# Patient Record
Sex: Female | Born: 1935
Health system: Southern US, Community
[De-identification: ages and names within clinical notes are randomized; demographics above are authoritative.]

## PROBLEM LIST (undated history)

## (undated) DIAGNOSIS — Z85828 Personal history of other malignant neoplasm of skin: Secondary | ICD-10-CM

## (undated) DIAGNOSIS — N8110 Cystocele, unspecified: Secondary | ICD-10-CM

## (undated) DIAGNOSIS — T148XXA Other injury of unspecified body region, initial encounter: Secondary | ICD-10-CM

## (undated) DIAGNOSIS — I10 Essential (primary) hypertension: Secondary | ICD-10-CM

## (undated) HISTORY — DX: Personal history of other malignant neoplasm of skin: Z85.828

## (undated) HISTORY — PX: ABDOMINAL HYSTERECTOMY: SHX81

---

## 2006-10-07 ENCOUNTER — Emergency Department: Payer: Self-pay | Admitting: Emergency Medicine

## 2011-04-25 ENCOUNTER — Emergency Department: Payer: Self-pay

## 2012-06-12 ENCOUNTER — Emergency Department: Payer: Self-pay | Admitting: *Deleted

## 2012-06-12 LAB — CBC WITH DIFFERENTIAL/PLATELET
Basophil %: 0.7 %
Eosinophil %: 2.3 %
HCT: 41.9 % (ref 35.0–47.0)
HGB: 14.5 g/dL (ref 12.0–16.0)
Lymphocyte %: 19.5 %
MCH: 32.3 pg (ref 26.0–34.0)
Neutrophil %: 69.4 %

## 2012-06-12 LAB — URINALYSIS, COMPLETE
Bilirubin,UR: NEGATIVE
Ketone: NEGATIVE
Ph: 6 (ref 4.5–8.0)
Protein: NEGATIVE
Specific Gravity: 1.012 (ref 1.003–1.030)

## 2012-06-12 LAB — BASIC METABOLIC PANEL
Anion Gap: 9 (ref 7–16)
BUN: 16 mg/dL (ref 7–18)
Chloride: 105 mmol/L (ref 98–107)
Creatinine: 0.88 mg/dL (ref 0.60–1.30)
EGFR (African American): >60
EGFR (Non-African Amer.): >60
Osmolality: 282 (ref 275–301)
Potassium: 4.1 mmol/L (ref 3.5–5.1)
Sodium: 141 mmol/L (ref 136–145)

## 2012-12-02 DIAGNOSIS — M549 Dorsalgia, unspecified: Secondary | ICD-10-CM | POA: Insufficient documentation

## 2013-02-13 ENCOUNTER — Encounter: Payer: Self-pay | Admitting: Family Medicine

## 2013-03-12 ENCOUNTER — Encounter: Payer: Self-pay | Admitting: Family Medicine

## 2013-08-26 ENCOUNTER — Ambulatory Visit: Payer: Self-pay | Admitting: Ophthalmology

## 2013-09-16 ENCOUNTER — Ambulatory Visit: Payer: Self-pay | Admitting: Ophthalmology

## 2013-11-27 ENCOUNTER — Emergency Department: Payer: Self-pay | Admitting: Emergency Medicine

## 2014-03-23 ENCOUNTER — Ambulatory Visit: Payer: Self-pay | Admitting: Obstetrics and Gynecology

## 2015-05-29 ENCOUNTER — Emergency Department: Payer: Medicare HMO

## 2015-05-29 ENCOUNTER — Encounter: Payer: Self-pay | Admitting: Radiology

## 2015-05-29 ENCOUNTER — Emergency Department
Admission: EM | Admit: 2015-05-29 | Discharge: 2015-05-29 | Disposition: A | Discharge status: Home / Self Care | Payer: Medicare HMO | Attending: Emergency Medicine | Admitting: Emergency Medicine

## 2015-05-29 DIAGNOSIS — K5730 Diverticulosis of large intestine without perforation or abscess without bleeding: Secondary | ICD-10-CM

## 2015-05-29 DIAGNOSIS — I10 Essential (primary) hypertension: Secondary | ICD-10-CM | POA: Insufficient documentation

## 2015-05-29 DIAGNOSIS — R1032 Left lower quadrant pain: Secondary | ICD-10-CM | POA: Insufficient documentation

## 2015-05-29 DIAGNOSIS — M545 Low back pain, unspecified: Secondary | ICD-10-CM

## 2015-05-29 DIAGNOSIS — R109 Unspecified abdominal pain: Secondary | ICD-10-CM

## 2015-05-29 HISTORY — DX: Other injury of unspecified body region, initial encounter: T14.8XXA

## 2015-05-29 HISTORY — DX: Cystocele, unspecified: N81.10

## 2015-05-29 HISTORY — DX: Essential (primary) hypertension: I10

## 2015-05-29 LAB — URINALYSIS COMPLETE WITH MICROSCOPIC (ARMC ONLY)
Bacteria, UA: NONE SEEN
Bilirubin Urine: NEGATIVE
GLUCOSE, UA: NEGATIVE mg/dL
Ketones, ur: NEGATIVE mg/dL
NITRITE: NEGATIVE
Protein, ur: NEGATIVE mg/dL
Specific Gravity, Urine: 1.002 — ABNORMAL LOW (ref 1.005–1.030)
Squamous Epithelial / LPF: NONE SEEN
pH: 6 (ref 5.0–8.0)

## 2015-05-29 MED ORDER — OXYCODONE-ACETAMINOPHEN 5-325 MG PO TABS: 1.0000 | ORAL_TABLET | ORAL | Status: DC | PRN

## 2015-05-29 NOTE — ED Provider Notes (Signed)
[facility] Emergency Department Provider Note  ____________________________________________  Time seen: 9:50 AM  I have reviewed the triage vital signs and the nursing notes.   HISTORY  Chief Complaint Urinary Tract Infection     HPI Alexis Neal is a 79 y.o. female resents with low back pain described as burning currently 6 out of 10 for approximately 2 months. Worsen over the last couple days patient is concern for possibility of urinary tract infection for which she says she's had multiple secondary to a prolapsed bladder. Denies any difficulty urinating no pain on urination no fever no chills. Of note the patient states this may very well be her sciatica as she does suffer from that as well.    Past medical history Sciatica Prolapsed bladder UTI There are no active problems to display for this patient.  Current Outpatient Rx  Name  Route  Sig  Dispense  Refill  . oxyCODONE-acetaminophen (ROXICET) 5-325 MG per tablet   Oral   Take 1 tablet by mouth every 4 (four) hours as needed for severe pain.   20 tablet   0     Allergies Review of patient's allergies indicates no known allergies.  History reviewed. No pertinent family history.  Social History History  Substance Use Topics  . Smoking status: Never Smoker   . Smokeless tobacco: Not on file  . Alcohol Use: No    Review of Systems  Constitutional: Negative for fever. Eyes: Negative for visual changes. ENT: Negative for sore throat. Cardiovascular: Negative for chest pain. Respiratory: Negative for shortness of breath. Gastrointestinal: Negative for abdominal pain, vomiting and diarrhea. Genitourinary: Negative for dysuria. Musculoskeletal: Positive for back pain. Skin: Negative for rash. Neurological: Negative for headaches, focal weakness or numbness.   10-point ROS otherwise negative.  ____________________________________________   PHYSICAL EXAM:  VITAL SIGNS: ED  Triage Vitals  Enc Vitals Group     BP 05/29/15 0908 181/36 mmHg     Pulse Rate 05/29/15 0908 95     Resp 05/29/15 0908 18     Temp 05/29/15 0908 98 F (36.7 C)     Temp Source 05/29/15 0908 Oral     SpO2 05/29/15 0908 52 %     Weight --      Height --      Head Cir --      Peak Flow --      Pain Score 05/29/15 0908 9     Pain Loc --      Pain Edu? --      Excl. in [place]? --      Constitutional: Alert and oriented. Well appearing and in no distress. Eyes: Conjunctivae are normal. PERRL. Normal extraocular movements. ENT   Head: Normocephalic and atraumatic.   Nose: No congestion/rhinnorhea.   Mouth/Throat: Mucous membranes are moist.   Neck: No stridor. Hematological/Lymphatic/Immunilogical: No cervical lymphadenopathy. Cardiovascular: Normal rate, regular rhythm. Normal and symmetric distal pulses are present in all extremities. No murmurs, rubs, or gallops. Respiratory: Normal respiratory effort without tachypnea nor retractions. Breath sounds are clear and equal bilaterally. No wheezes/rales/rhonchi. Gastrointestinal: Soft and nontender. No distention. There is no CVA tenderness. Genitourinary: deferred Musculoskeletal: Nontender with normal range of motion in all extremities. No joint effusions.  No lower extremity tenderness nor edema. Pain with palpation of paraspinal muscles. No CVA tenderness. Neurologic:  Normal speech and language. No gross focal neurologic deficits are appreciated. Speech is normal.  Skin:  Skin is warm, dry and intact. No rash noted.  Psychiatric: Mood and affect are normal. Speech and behavior are normal. Patient exhibits appropriate insight and judgment.  ____________________________________________    LABS (pertinent positives/negatives)  Labs Reviewed  URINALYSIS COMPLETEWITH MICROSCOPIC ([place]) - Abnormal; Notable for the following:    Color, Urine COLORLESS (*)    APPearance CLEAR (*)    Specific Gravity, Urine 1.002 (*)     Hgb urine dipstick 1+ (*)    Leukocytes, UA TRACE (*)    All other components within normal limits         RADIOLOGY  CT abdomen and pelvis revealed diverticulosis   Bentley, Haralson #[id number] (78 y.o. F) ED4A        Lab Results       Urinalysis complete, with microscopic- may I&O cath if menses [facility] only) (Final result)Abnormal Component (Lab Inquiry)    Collection Time Result Time Color, Urine APPearance GLUCOSE U BILI UR Ketones, ur   05/29/15 09:17:00 05/29/15 09:26:07 COLORLESS (A) CLEAR (A) NEGATIVE NEGATIVE NEGATIVE      Collection Time Result Time Specific Gravity, Urine Hgb urine dipstick pH Protein, ur Nitrite   05/29/15 09:17:00 05/29/15 09:26:07 1.002 (L) 1+ (A) 6.0 NEGATIVE NEGATIVE      Collection Time Result Time LEUKOCYTES RBC / HPF WBC U BACTERIA Squamous Epithelial / LPF   05/29/15 09:17:00 05/29/15 09:26:07 TRACE (A) 0-5 0-5 NONE SEEN NONE SEEN           Imaging Results       CT RENAL STONE STUDY (Final result) Result time: 05/29/15 10:33:40   Final result by Rad Results In Interface (05/29/15 10:33:40)   Narrative:   CLINICAL DATA: Left flank pain  EXAM: CT ABDOMEN AND PELVIS WITHOUT CONTRAST  TECHNIQUE: Multidetector CT imaging of the abdomen and pelvis was performed following the standard protocol without IV contrast.  COMPARISON: 03/23/2014  FINDINGS: Lower chest: There is no pleural effusion identified. The lung bases are clear. Changes of centrilobular emphysema noted.  Hepatobiliary: Mild diffuse hepatic steatosis. No focal liver abnormality identified. The gallbladder appears normal. There is no biliary dilatation.  Pancreas: Negative  Spleen: The spleen is unremarkable.  Adrenals/Urinary Tract: The adrenal glands are both normal. Scarring is noted involving the upper pole the right kidney. No right-sided nephrolithiasis or hydronephrosis. There is no left hydronephrosis or hydroureter. No  left-sided kidney stones. The urinary bladder appears within normal limits.  Stomach/Bowel: Small hiatal hernia. The stomach and the small bowel loops have a normal course and caliber. There is no evidence for a bowel obstruction. The appendix is visualized and appears normal. Numerous colonic diverticula are noted. There is no acute inflammation.  Vascular/Lymphatic: Calcified atherosclerotic disease involves the abdominal aorta. No aneurysm. No enlarged retroperitoneal or mesenteric adenopathy. No enlarged pelvic or inguinal lymph nodes.  Reproductive: Previous hysterectomy. There is no adnexal mass.  Other: No free fluid or fluid collections within the abdomen or the pelvis.  Musculoskeletal: Degenerative disc disease is present within the lumbar spine. No acute bone abnormality noted.  IMPRESSION: 1. No acute findings and no explanation for left flank pain. 2. No nephrolithiasis or obstructive uropathy. 3. Colonic diverticulosis without acute inflammation. 4. Aortic atherosclerosis 5. Hepatic steatosis.   Electronically Signed By: Kerby Moors M.D. On: 05/29/2015 10:33        ECG Results    None        INITIAL IMPRESSION / ASSESSMENT AND PLAN / ED COURSE  Pertinent labs & imaging results that were available during my care of the  patient were reviewed by me and considered in my medical decision making (see chart for details).    ____________________________________________   FINAL CLINICAL IMPRESSION(S) / ED DIAGNOSES  Final diagnoses:  Left flank pain  Bilateral low back pain without sciatica  Diverticulosis of large intestine without hemorrhage      Gregor Hams, MD 05/31/15 [phone]

## 2015-05-29 NOTE — Discharge Instructions (Signed)
Back Pain, Adult Low back pain is very common. About 1 in 5 people have back pain.The cause of low back pain is rarely dangerous. The pain often gets better over time.About half of people with a sudden onset of back pain feel better in just 2 weeks. About 8 in 10 people feel better by 6 weeks.  CAUSES Some common causes of back pain include:  Strain of the muscles or ligaments supporting the spine.  Wear and tear (degeneration) of the spinal discs.  Arthritis.  Direct injury to the back. DIAGNOSIS Most of the time, the direct cause of low back pain is not known.However, back pain can be treated effectively even when the exact cause of the pain is unknown.Answering your caregiver's questions about your overall health and symptoms is one of the most accurate ways to make sure the cause of your pain is not dangerous. If your caregiver needs more information, he or she may order lab work or imaging tests (X-rays or MRIs).However, even if imaging tests show changes in your back, this usually does not require surgery. HOME CARE INSTRUCTIONS For many people, back pain returns.Since low back pain is rarely dangerous, it is often a condition that people can learn to manageon their own.   Remain active. It is stressful on the back to sit or stand in one place. Do not sit, drive, or stand in one place for more than 30 minutes at a time. Take short walks on level surfaces as soon as pain allows.Try to increase the length of time you walk each day.  Do not stay in bed.Resting more than 1 or 2 days can delay your recovery.  Do not avoid exercise or work.Your body is made to move.It is not dangerous to be active, even though your back may hurt.Your back will likely heal faster if you return to being active before your pain is gone.  Pay attention to your body when you bend and lift. Many people have less discomfortwhen lifting if they bend their knees, keep the load close to their bodies,and  avoid twisting. Often, the most comfortable positions are those that put less stress on your recovering back.  Find a comfortable position to sleep. Use a firm mattress and lie on your side with your knees slightly bent. If you lie on your back, put a pillow under your knees.  Only take over-the-counter or prescription medicines as directed by your caregiver. Over-the-counter medicines to reduce pain and inflammation are often the most helpful.Your caregiver may prescribe muscle relaxant drugs.These medicines help dull your pain so you can more quickly return to your normal activities and healthy exercise.  Put ice on the injured area.  Put ice in a plastic bag.  Place a towel between your skin and the bag.  Leave the ice on for 15-20 minutes, 03-04 times a day for the first 2 to 3 days. After that, ice and heat may be alternated to reduce pain and spasms.  Ask your caregiver about trying back exercises and gentle massage. This may be of some benefit.  Avoid feeling anxious or stressed.Stress increases muscle tension and can worsen back pain.It is important to recognize when you are anxious or stressed and learn ways to manage it.Exercise is a great option. SEEK MEDICAL CARE IF:  You have pain that is not relieved with rest or medicine.  You have pain that does not improve in 1 week.  You have new symptoms.  You are generally not feeling well. SEEK   IMMEDIATE MEDICAL CARE IF:   You have pain that radiates from your back into your legs.  You develop new bowel or bladder control problems.  You have unusual weakness or numbness in your arms or legs.  You develop nausea or vomiting.  You develop abdominal pain.  You feel faint. Document Released: 10/29/2005 Document Revised: 04/29/2012 Document Reviewed: 03/02/2014 Exit[employer] Patient Information 2015 [employer]. This information is not intended to replace advice given to you by your health care provider. Make sure you  discuss any questions you have with your health care provider.  

## 2015-05-29 NOTE — ED Notes (Signed)
MD Brown at bedside.

## 2015-05-29 NOTE — ED Notes (Signed)
Pt reports lower back pain, flank pain and lower abdominal pain for 2 months. Pain described at burning. Pt concerned about UTI.

## 2015-05-29 NOTE — ED Notes (Signed)
Pt walked to the restroom.

## 2015-05-29 NOTE — ED Notes (Signed)
Pt to CT

## 2016-01-19 DIAGNOSIS — Z85828 Personal history of other malignant neoplasm of skin: Secondary | ICD-10-CM | POA: Diagnosis not present

## 2016-01-19 DIAGNOSIS — C44300 Unspecified malignant neoplasm of skin of unspecified part of face: Secondary | ICD-10-CM | POA: Insufficient documentation

## 2016-01-19 DIAGNOSIS — R3129 Other microscopic hematuria: Secondary | ICD-10-CM | POA: Diagnosis not present

## 2016-01-19 DIAGNOSIS — K449 Diaphragmatic hernia without obstruction or gangrene: Secondary | ICD-10-CM | POA: Insufficient documentation

## 2016-01-19 DIAGNOSIS — Z780 Asymptomatic menopausal state: Secondary | ICD-10-CM | POA: Diagnosis not present

## 2016-01-19 DIAGNOSIS — K760 Fatty (change of) liver, not elsewhere classified: Secondary | ICD-10-CM | POA: Diagnosis not present

## 2016-01-19 DIAGNOSIS — M5416 Radiculopathy, lumbar region: Secondary | ICD-10-CM | POA: Diagnosis not present

## 2016-01-19 DIAGNOSIS — K5730 Diverticulosis of large intestine without perforation or abscess without bleeding: Secondary | ICD-10-CM | POA: Insufficient documentation

## 2016-01-19 DIAGNOSIS — I10 Essential (primary) hypertension: Secondary | ICD-10-CM | POA: Diagnosis not present

## 2016-01-19 DIAGNOSIS — H0263 Xanthelasma of right eye, unspecified eyelid: Secondary | ICD-10-CM | POA: Insufficient documentation

## 2016-01-20 ENCOUNTER — Other Ambulatory Visit: Payer: Self-pay | Admitting: Family Medicine

## 2016-01-20 DIAGNOSIS — Z780 Asymptomatic menopausal state: Secondary | ICD-10-CM

## 2016-01-26 ENCOUNTER — Telehealth: Payer: Self-pay | Admitting: Obstetrics and Gynecology

## 2016-01-26 NOTE — Telephone Encounter (Signed)
I received a referral from Alexis Neal from [facility] and when i called to make the appt the pt did not want to schd one at this time. She said she would cb later when she was ready to make one.  michelle

## 2016-07-03 DIAGNOSIS — K449 Diaphragmatic hernia without obstruction or gangrene: Secondary | ICD-10-CM | POA: Diagnosis not present

## 2016-07-03 DIAGNOSIS — M5442 Lumbago with sciatica, left side: Secondary | ICD-10-CM | POA: Diagnosis not present

## 2016-07-03 DIAGNOSIS — H0263 Xanthelasma of right eye, unspecified eyelid: Secondary | ICD-10-CM | POA: Diagnosis not present

## 2016-07-03 DIAGNOSIS — Z7689 Persons encountering health services in other specified circumstances: Secondary | ICD-10-CM | POA: Diagnosis not present

## 2016-07-03 DIAGNOSIS — G8929 Other chronic pain: Secondary | ICD-10-CM | POA: Diagnosis not present

## 2016-07-03 DIAGNOSIS — I10 Essential (primary) hypertension: Secondary | ICD-10-CM | POA: Diagnosis not present

## 2016-12-26 DIAGNOSIS — Z7689 Persons encountering health services in other specified circumstances: Secondary | ICD-10-CM | POA: Diagnosis not present

## 2016-12-26 DIAGNOSIS — M5442 Lumbago with sciatica, left side: Secondary | ICD-10-CM | POA: Diagnosis not present

## 2016-12-26 DIAGNOSIS — G8929 Other chronic pain: Secondary | ICD-10-CM | POA: Diagnosis not present

## 2016-12-26 DIAGNOSIS — K449 Diaphragmatic hernia without obstruction or gangrene: Secondary | ICD-10-CM | POA: Diagnosis not present

## 2016-12-26 DIAGNOSIS — I10 Essential (primary) hypertension: Secondary | ICD-10-CM | POA: Diagnosis not present

## 2016-12-26 DIAGNOSIS — H0263 Xanthelasma of right eye, unspecified eyelid: Secondary | ICD-10-CM | POA: Diagnosis not present

## 2016-12-31 DIAGNOSIS — M7061 Trochanteric bursitis, right hip: Secondary | ICD-10-CM | POA: Diagnosis not present

## 2016-12-31 DIAGNOSIS — K449 Diaphragmatic hernia without obstruction or gangrene: Secondary | ICD-10-CM | POA: Diagnosis not present

## 2016-12-31 DIAGNOSIS — Z0000 Encounter for general adult medical examination without abnormal findings: Secondary | ICD-10-CM | POA: Diagnosis not present

## 2016-12-31 DIAGNOSIS — I10 Essential (primary) hypertension: Secondary | ICD-10-CM | POA: Diagnosis not present

## 2017-03-09 ENCOUNTER — Emergency Department
Admission: EM | Admit: 2017-03-09 | Discharge: 2017-03-09 | Disposition: A | Discharge status: Home / Self Care | Payer: PPO | Attending: Emergency Medicine | Admitting: Emergency Medicine

## 2017-03-09 ENCOUNTER — Encounter: Payer: Self-pay | Admitting: Emergency Medicine

## 2017-03-09 ENCOUNTER — Emergency Department: Payer: PPO

## 2017-03-09 DIAGNOSIS — J705 Respiratory conditions due to smoke inhalation: Secondary | ICD-10-CM | POA: Insufficient documentation

## 2017-03-09 DIAGNOSIS — Z79899 Other long term (current) drug therapy: Secondary | ICD-10-CM | POA: Diagnosis not present

## 2017-03-09 DIAGNOSIS — I10 Essential (primary) hypertension: Secondary | ICD-10-CM | POA: Diagnosis not present

## 2017-03-09 DIAGNOSIS — J449 Chronic obstructive pulmonary disease, unspecified: Secondary | ICD-10-CM | POA: Diagnosis not present

## 2017-03-09 DIAGNOSIS — T59811A Toxic effect of smoke, accidental (unintentional), initial encounter: Secondary | ICD-10-CM
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
## 2017-04-26 DIAGNOSIS — M7061 Trochanteric bursitis, right hip: Secondary | ICD-10-CM | POA: Diagnosis not present

## 2017-04-26 DIAGNOSIS — I10 Essential (primary) hypertension: Secondary | ICD-10-CM | POA: Diagnosis not present

## 2017-04-26 DIAGNOSIS — K449 Diaphragmatic hernia without obstruction or gangrene: Secondary | ICD-10-CM | POA: Diagnosis not present

## 2017-08-28 DIAGNOSIS — M5442 Lumbago with sciatica, left side: Secondary | ICD-10-CM | POA: Diagnosis not present

## 2017-08-28 DIAGNOSIS — I10 Essential (primary) hypertension: Secondary | ICD-10-CM | POA: Diagnosis not present

## 2017-08-28 DIAGNOSIS — G8929 Other chronic pain: Secondary | ICD-10-CM | POA: Diagnosis not present

## 2017-09-26 DIAGNOSIS — H109 Unspecified conjunctivitis: Secondary | ICD-10-CM | POA: Diagnosis not present

## 2017-09-26 DIAGNOSIS — I10 Essential (primary) hypertension: Secondary | ICD-10-CM | POA: Diagnosis not present

## 2017-09-26 DIAGNOSIS — R1032 Left lower quadrant pain: Secondary | ICD-10-CM | POA: Diagnosis not present

## 2017-09-26 DIAGNOSIS — J019 Acute sinusitis, unspecified: Secondary | ICD-10-CM | POA: Diagnosis not present

## 2017-12-16 DIAGNOSIS — G8929 Other chronic pain: Secondary | ICD-10-CM | POA: Diagnosis not present

## 2017-12-16 DIAGNOSIS — M5442 Lumbago with sciatica, left side: Secondary | ICD-10-CM | POA: Diagnosis not present

## 2017-12-16 DIAGNOSIS — I10 Essential (primary) hypertension: Secondary | ICD-10-CM | POA: Diagnosis not present

## 2017-12-16 DIAGNOSIS — K5730 Diverticulosis of large intestine without perforation or abscess without bleeding: Secondary | ICD-10-CM | POA: Diagnosis not present

## 2017-12-16 DIAGNOSIS — R32 Unspecified urinary incontinence: Secondary | ICD-10-CM | POA: Diagnosis not present

## 2017-12-21 ENCOUNTER — Emergency Department: Payer: PPO

## 2017-12-21 ENCOUNTER — Encounter: Payer: Self-pay | Admitting: Emergency Medicine

## 2017-12-21 DIAGNOSIS — S8991XA Unspecified injury of right lower leg, initial encounter: Secondary | ICD-10-CM | POA: Diagnosis not present

## 2017-12-21 DIAGNOSIS — S0990XA Unspecified injury of head, initial encounter: Secondary | ICD-10-CM | POA: Insufficient documentation

## 2017-12-21 DIAGNOSIS — S8992XA Unspecified injury of left lower leg, initial encounter: Secondary | ICD-10-CM | POA: Diagnosis not present

## 2017-12-21 DIAGNOSIS — Y939 Activity, unspecified: Secondary | ICD-10-CM | POA: Diagnosis not present

## 2017-12-21 DIAGNOSIS — I10 Essential (primary) hypertension: Secondary | ICD-10-CM | POA: Insufficient documentation

## 2017-12-21 DIAGNOSIS — S20212A Contusion of left front wall of thorax, initial encounter: Secondary | ICD-10-CM | POA: Diagnosis not present

## 2017-12-21 DIAGNOSIS — M25561 Pain in right knee: Secondary | ICD-10-CM | POA: Diagnosis not present

## 2017-12-21 DIAGNOSIS — Y998 Other external cause status: Secondary | ICD-10-CM | POA: Insufficient documentation

## 2017-12-21 DIAGNOSIS — Y929 Unspecified place or not applicable: Secondary | ICD-10-CM | POA: Diagnosis not present

## 2017-12-21 DIAGNOSIS — W101XXA Fall (on)(from) sidewalk curb, initial encounter: Secondary | ICD-10-CM | POA: Diagnosis not present

## 2017-12-21 DIAGNOSIS — M25562 Pain in left knee: Secondary | ICD-10-CM | POA: Diagnosis not present

## 2017-12-21 DIAGNOSIS — S299XXA Unspecified injury of thorax, initial encounter: Secondary | ICD-10-CM | POA: Diagnosis not present

## 2017-12-21 DIAGNOSIS — S80212A Abrasion, left knee, initial encounter: Secondary | ICD-10-CM | POA: Insufficient documentation

## 2017-12-21 DIAGNOSIS — S0993XA Unspecified injury of face, initial encounter: Secondary | ICD-10-CM | POA: Diagnosis not present

## 2017-12-21 DIAGNOSIS — S80211A Abrasion, right knee, initial encounter: Secondary | ICD-10-CM | POA: Insufficient documentation

## 2017-12-21 DIAGNOSIS — S199XXA Unspecified injury of neck, initial encounter: Secondary | ICD-10-CM | POA: Diagnosis not present
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
## 2017-12-22 ENCOUNTER — Other Ambulatory Visit: Payer: Self-pay

## 2017-12-22 ENCOUNTER — Emergency Department
Admission: EM | Admit: 2017-12-22 | Discharge: 2017-12-22 | Disposition: A | Discharge status: Home / Self Care | Payer: PPO | Attending: Emergency Medicine | Admitting: Emergency Medicine

## 2017-12-22 ENCOUNTER — Emergency Department: Payer: PPO

## 2017-12-22 DIAGNOSIS — S80211A Abrasion, right knee, initial encounter: Secondary | ICD-10-CM | POA: Diagnosis not present

## 2017-12-22 DIAGNOSIS — S80212A Abrasion, left knee, initial encounter: Secondary | ICD-10-CM

## 2017-12-22 DIAGNOSIS — S20212A Contusion of left front wall of thorax, initial encounter: Secondary | ICD-10-CM

## 2017-12-22 DIAGNOSIS — S299XXA Unspecified injury of thorax, initial encounter: Secondary | ICD-10-CM | POA: Diagnosis not present

## 2017-12-22 DIAGNOSIS — W19XXXA Unspecified fall, initial encounter: Secondary | ICD-10-CM

## 2017-12-22 DIAGNOSIS — S0990XA Unspecified injury of head, initial encounter: Secondary | ICD-10-CM

## 2017-12-22 MED ORDER — ONDANSETRON 4 MG PO TBDP: 4.0000 mg | ORAL_TABLET | Freq: Three times a day (TID) | ORAL | 0 refills | Status: DC | PRN

## 2017-12-22 MED ORDER — TRAMADOL HCL 50 MG PO TABS: 50.0000 mg | ORAL_TABLET | Freq: Four times a day (QID) | ORAL | 0 refills | Status: DC | PRN

## 2017-12-22 MED ORDER — TRAMADOL HCL 50 MG PO TABS
50.0000 mg | ORAL_TABLET | Freq: Once | ORAL | Status: AC
  Administered 2017-12-22: 50 mg via ORAL
  Filled 2017-12-22: qty 1

## 2017-12-22 MED ORDER — ONDANSETRON 4 MG PO TBDP
4.0000 mg | ORAL_TABLET | Freq: Once | ORAL | Status: AC
  Administered 2017-12-22: 4 mg via ORAL
  Filled 2017-12-22: qty 1

## 2017-12-22 NOTE — ED Notes (Signed)
Incentive spirometry given and pt trained with return demo

## 2017-12-22 NOTE — ED Provider Notes (Signed)
[facility] Emergency Department Provider Note   ____________________________________________   First MD Initiated Contact with Patient 12/22/17 [phone]     (approximate)  I have reviewed the triage vital signs and the nursing notes.   HISTORY  Chief Complaint Fall    HPI Alexis Neal is a 82 y.o. female who presents to the ED from home with a chief complaint of mechanical fall.  Patient reports approximately 7 PM she stepped off a curb, lost her balance, fell forward and struck her face.  States the fall broke her dentures.  Denies LOC.  Use.  Complains of jaw pain and bilateral knee pain.  Denies vision changes, neck pain, chest pain, shortness of breath, abdominal pain, nausea or vomiting.  Does complain of left-sided rib pain; states her glasses were in her pocket and she must of fallen on them.   Past Medical History:  Diagnosis Date  . Female bladder prolapse   . Hypertension   . Nerve compression     There are no active problems to display for this patient.   Past Surgical History:  Procedure Laterality Date  . ABDOMINAL HYSTERECTOMY      Prior to Admission medications   Medication Sig Start Date End Date Taking? Authorizing Provider  oxyCODONE-acetaminophen (ROXICET) 5-325 MG per tablet Take 1 tablet by mouth every 4 (four) hours as needed for severe pain. 05/29/15   Gregor Hams, MD    Allergies Patient has no known allergies.  No family history on file.  Social History Social History   Tobacco Use  . Smoking status: Never Smoker  . Smokeless tobacco: Never Used  Substance Use Topics  . Alcohol use: No  . Drug use: No    Review of Systems  Constitutional: No fever/chills. Eyes: No visual changes. ENT: Positive for jaw pain.  No sore throat. Cardiovascular: Positive for left rib pain. Respiratory: Denies shortness of breath. Gastrointestinal: No abdominal pain.  No nausea, no vomiting.  No diarrhea.  No  constipation. Genitourinary: Negative for dysuria. Musculoskeletal: Positive for bilateral knee pain.  Negative for back pain. Skin: Negative for rash. Neurological: Negative for headaches, focal weakness or numbness.   ____________________________________________   PHYSICAL EXAM:  VITAL SIGNS: ED Triage Vitals [12/21/17 2014]  Enc Vitals Group     BP (!) 150/71     Pulse Rate 61     Resp 18     Temp 97.9 F (36.6 C)     Temp Source Oral     SpO2 96 %     Weight 184 lb (83.5 kg)     Height 5\' 6"  (1.676 m)     Head Circumference      Peak Flow      Pain Score 10     Pain Loc      Pain Edu?      Excl. in [place]?     Constitutional: Alert and oriented. Well appearing and in no acute distress. Eyes: Conjunctivae are normal. PERRL. EOMI. Head: Atraumatic.  No facial contusions. Nose: No deformity noted.. Mouth/Throat: No dental malocclusion.  Small contusion to upper palate without active bleeding.   Neck: No stridor.  No cervical spine tenderness to palpation. Cardiovascular: Normal rate, regular rhythm. Grossly normal heart sounds.  Good peripheral circulation. Respiratory: Normal respiratory effort.  No retractions. Lungs CTAB. Gastrointestinal: Soft and nontender. No distention. No abdominal bruits. No CVA tenderness. Musculoskeletal: Small abrasions to bilateral anterior knees.  Full range of motion bilateral knees  without pain.  Ambulates with steady gait.  No joint effusions.. Neurologic:  Normal speech and language. No gross focal neurologic deficits are appreciated. No gait instability. Skin:  Skin is warm, dry and intact. No rash noted. Psychiatric: Mood and affect are normal. Speech and behavior are normal.  ____________________________________________   LABS (all labs ordered are listed, but only abnormal results are displayed)  Labs Reviewed - No data to  display ____________________________________________  EKG  None ____________________________________________  RADIOLOGY  ED MD interpretation: No ICH, no cervical fracture, no facial fracture, bilateral knees without fracture, rib fracture without pneumothorax  Official radiology report(s): Dg Ribs Unilateral W/chest Left  Result Date: 12/22/2017 CLINICAL DATA:  Fall, hit ribs EXAM: LEFT RIBS AND CHEST - 3+ VIEW COMPARISON:  03/09/2017 FINDINGS: Single-view chest demonstrates mild diffuse bronchitic changes. No consolidation or effusion. Cardiomediastinal silhouette is within normal limits. No pneumothorax. Left rib series demonstrates old left fourth rib fracture. Possible acute eighth rib fracture. IMPRESSION: 1. Negative for pleural effusion or pneumothorax 2. Possible acute left eighth rib fracture Electronically Signed   By: Donavan Foil M.D.   On: 12/22/2017 01:44   Ct Head Wo Contrast  Result Date: 12/21/2017 CLINICAL DATA:  Patient states that she was stepping off of a curb about 19:00 tonight and fell. Patient states that she hit her face. Patient denies LOC and denies taking blood thinners. Patient states that states that it broke her dentures and is complaining of bilateral jaw pain EXAM: CT HEAD WITHOUT CONTRAST CT MAXILLOFACIAL WITHOUT CONTRAST CT CERVICAL SPINE WITHOUT CONTRAST TECHNIQUE: Multidetector CT imaging of the head, cervical spine, and maxillofacial structures were performed using the standard protocol without intravenous contrast. Multiplanar CT image reconstructions of the cervical spine and maxillofacial structures were also generated. COMPARISON:  None. FINDINGS: CT HEAD FINDINGS Brain: No evidence of acute infarction, hemorrhage, hydrocephalus, extra-axial collection or mass lesion/mass effect. Vascular: No hyperdense vessel or unexpected calcification. Skull: Normal. Negative for fracture or focal lesion. Other: None. CT MAXILLOFACIAL FINDINGS Osseous: No acute  fracture.  No bone lesion. Orbits: Negative. No traumatic or inflammatory finding. Sinuses: Clear. Soft tissues: No obvious soft tissue contusion or hematoma. No soft tissue mass or adenopathy. CT CERVICAL SPINE FINDINGS Alignment: Mild kyphosis of the lower cervical spine. No spondylolisthesis. Skull base and vertebrae: No acute fracture. No primary bone lesion or focal pathologic process. Soft tissues and spinal canal: No prevertebral fluid or swelling. No visible canal hematoma. Disc levels: Marked loss of disc height with endplate sclerosis and osteophytes at C3-C4. Moderate loss of disc height at C4-C5, C5-C6, C6-C7 and C7-T1. Spondylotic disc bulging with endplate spurring is noted at all of these levels. There are facet degenerative changes that are greatest on the left from C2-C3 through C4-C5. No convincing disc herniation. Upper chest: No acute findings. No masses. Lung apices show emphysema and mild scarring. Other: None. IMPRESSION: HEAD CT 1. No intracranial abnormality. 2. No skull fracture. MAXILLOFACIAL CT 1. No fracture or acute finding. CERVICAL CT 1. No fracture or acute finding. Electronically Signed   By: Lajean Manes M.D.   On: 12/21/2017 20:52   Ct Cervical Spine Wo Contrast  Result Date: 12/21/2017 CLINICAL DATA:  Patient states that she was stepping off of a curb about 19:00 tonight and fell. Patient states that she hit her face. Patient denies LOC and denies taking blood thinners. Patient states that states that it broke her dentures and is complaining of bilateral jaw pain EXAM: CT HEAD WITHOUT CONTRAST CT  MAXILLOFACIAL WITHOUT CONTRAST CT CERVICAL SPINE WITHOUT CONTRAST TECHNIQUE: Multidetector CT imaging of the head, cervical spine, and maxillofacial structures were performed using the standard protocol without intravenous contrast. Multiplanar CT image reconstructions of the cervical spine and maxillofacial structures were also generated. COMPARISON:  None. FINDINGS: CT HEAD  FINDINGS Brain: No evidence of acute infarction, hemorrhage, hydrocephalus, extra-axial collection or mass lesion/mass effect. Vascular: No hyperdense vessel or unexpected calcification. Skull: Normal. Negative for fracture or focal lesion. Other: None. CT MAXILLOFACIAL FINDINGS Osseous: No acute fracture.  No bone lesion. Orbits: Negative. No traumatic or inflammatory finding. Sinuses: Clear. Soft tissues: No obvious soft tissue contusion or hematoma. No soft tissue mass or adenopathy. CT CERVICAL SPINE FINDINGS Alignment: Mild kyphosis of the lower cervical spine. No spondylolisthesis. Skull base and vertebrae: No acute fracture. No primary bone lesion or focal pathologic process. Soft tissues and spinal canal: No prevertebral fluid or swelling. No visible canal hematoma. Disc levels: Marked loss of disc height with endplate sclerosis and osteophytes at C3-C4. Moderate loss of disc height at C4-C5, C5-C6, C6-C7 and C7-T1. Spondylotic disc bulging with endplate spurring is noted at all of these levels. There are facet degenerative changes that are greatest on the left from C2-C3 through C4-C5. No convincing disc herniation. Upper chest: No acute findings. No masses. Lung apices show emphysema and mild scarring. Other: None. IMPRESSION: HEAD CT 1. No intracranial abnormality. 2. No skull fracture. MAXILLOFACIAL CT 1. No fracture or acute finding. CERVICAL CT 1. No fracture or acute finding. Electronically Signed   By: Lajean Manes M.D.   On: 12/21/2017 20:52   Dg Knee Complete 4 Views Left  Result Date: 12/21/2017 CLINICAL DATA:  C/o pain after tripping over curb and falling this PM EXAM: LEFT KNEE - COMPLETE 4+ VIEW COMPARISON:  None. FINDINGS: No fracture.  No bone lesion. The knee joint is normally spaced and aligned. No significant degenerative/arthropathic change. No joint effusion. Soft tissues are unremarkable. IMPRESSION: Negative. Electronically Signed   By: Lajean Manes M.D.   On: 12/21/2017 20:56    Dg Knee Complete 4 Views Right  Result Date: 12/21/2017 CLINICAL DATA:  C/o pain after tripping over curb and falling this PM EXAM: RIGHT KNEE - COMPLETE 4+ VIEW COMPARISON:  None. FINDINGS: No fracture.  No bone lesion. Minor marginal spurring from all 3 compartments. No other degenerative/arthropathic change. No convincing joint effusion. Soft tissues are unremarkable. IMPRESSION: No fracture or acute finding. Electronically Signed   By: Lajean Manes M.D.   On: 12/21/2017 20:57   Ct Maxillofacial Wo Contrast  Result Date: 12/21/2017 CLINICAL DATA:  Patient states that she was stepping off of a curb about 19:00 tonight and fell. Patient states that she hit her face. Patient denies LOC and denies taking blood thinners. Patient states that states that it broke her dentures and is complaining of bilateral jaw pain EXAM: CT HEAD WITHOUT CONTRAST CT MAXILLOFACIAL WITHOUT CONTRAST CT CERVICAL SPINE WITHOUT CONTRAST TECHNIQUE: Multidetector CT imaging of the head, cervical spine, and maxillofacial structures were performed using the standard protocol without intravenous contrast. Multiplanar CT image reconstructions of the cervical spine and maxillofacial structures were also generated. COMPARISON:  None. FINDINGS: CT HEAD FINDINGS Brain: No evidence of acute infarction, hemorrhage, hydrocephalus, extra-axial collection or mass lesion/mass effect. Vascular: No hyperdense vessel or unexpected calcification. Skull: Normal. Negative for fracture or focal lesion. Other: None. CT MAXILLOFACIAL FINDINGS Osseous: No acute fracture.  No bone lesion. Orbits: Negative. No traumatic or inflammatory finding. Sinuses: Clear. Soft  tissues: No obvious soft tissue contusion or hematoma. No soft tissue mass or adenopathy. CT CERVICAL SPINE FINDINGS Alignment: Mild kyphosis of the lower cervical spine. No spondylolisthesis. Skull base and vertebrae: No acute fracture. No primary bone lesion or focal pathologic process. Soft  tissues and spinal canal: No prevertebral fluid or swelling. No visible canal hematoma. Disc levels: Marked loss of disc height with endplate sclerosis and osteophytes at C3-C4. Moderate loss of disc height at C4-C5, C5-C6, C6-C7 and C7-T1. Spondylotic disc bulging with endplate spurring is noted at all of these levels. There are facet degenerative changes that are greatest on the left from C2-C3 through C4-C5. No convincing disc herniation. Upper chest: No acute findings. No masses. Lung apices show emphysema and mild scarring. Other: None. IMPRESSION: HEAD CT 1. No intracranial abnormality. 2. No skull fracture. MAXILLOFACIAL CT 1. No fracture or acute finding. CERVICAL CT 1. No fracture or acute finding. Electronically Signed   By: Lajean Manes M.D.   On: 12/21/2017 20:52    ____________________________________________   PROCEDURES  Procedure(s) performed: None  Procedures  Critical Care performed: No  ____________________________________________   INITIAL IMPRESSION / ASSESSMENT AND PLAN / ED COURSE  As part of my medical decision making, I reviewed the following data within the [facility] notes reviewed and incorporated, Radiograph reviewed and Notes from prior ED visits.   82 year old female who presents status post mechanical fall with facial pain and knee pain.  Differential diagnosis includes but is not limited to traumatic ICH, SDH, cervical fracture, facial fracture, knee fracture, rib fracture, contusions, abrasions, etc.  CT and plain film imaging studies are negative for acute traumatic injuries.  Will discharge home with prescriptions for tramadol, Zofran and patient will follow-up with her PCP closely.  Strict return precautions given.  Patient verbalizes understanding and agrees with plan of care.  Clinical Course as of Dec 23 207  Nancy Fetter Dec 22, 2017  0207 Updated patient of rib x-rays.  Will discharge home with incentive spirometer, analgesia  and she will follow-up closely with her PCP next week.  Strict return precautions given.  Patient verbalizes understanding agrees with plan of care.  [JS]    Clinical Course User Index [JS] Paulette Blanch, MD     ____________________________________________   FINAL CLINICAL IMPRESSION(S) / ED DIAGNOSES  Final diagnoses:  Fall, initial encounter  Knee abrasion, left, initial encounter  Knee abrasion, right, initial encounter  Rib contusion, left, initial encounter  Minor head injury, initial encounter     ED Discharge Orders    None       Note:  This document was prepared using Dragon voice recognition software and may include unintentional dictation errors.    Paulette Blanch, MD 12/22/17 [phone]

## 2017-12-22 NOTE — Discharge Instructions (Signed)
1.  You may take medicines as needed for pain and nausea (Ultram/Zofran). 2.  Apply ice to affected area several times daily. 3.  Return to the ER for worsening symptoms, persistent vomiting, difficulty breathing or other concerns.

## 2017-12-31 DIAGNOSIS — Z9181 History of falling: Secondary | ICD-10-CM | POA: Diagnosis not present

## 2017-12-31 DIAGNOSIS — I10 Essential (primary) hypertension: Secondary | ICD-10-CM | POA: Diagnosis not present

## 2017-12-31 DIAGNOSIS — K5900 Constipation, unspecified: Secondary | ICD-10-CM | POA: Insufficient documentation

## 2017-12-31 DIAGNOSIS — G8929 Other chronic pain: Secondary | ICD-10-CM | POA: Diagnosis not present

## 2017-12-31 DIAGNOSIS — M5442 Lumbago with sciatica, left side: Secondary | ICD-10-CM | POA: Diagnosis not present

## 2017-12-31 DIAGNOSIS — Z0000 Encounter for general adult medical examination without abnormal findings: Secondary | ICD-10-CM | POA: Diagnosis not present

## 2018-01-09 DIAGNOSIS — H353223 Exudative age-related macular degeneration, left eye, with inactive scar: Secondary | ICD-10-CM | POA: Diagnosis not present

## 2018-01-29 DIAGNOSIS — H02056 Trichiasis without entropian left eye, unspecified eyelid: Secondary | ICD-10-CM | POA: Diagnosis not present

## 2018-02-18 DIAGNOSIS — K146 Glossodynia: Secondary | ICD-10-CM | POA: Diagnosis not present

## 2018-02-18 DIAGNOSIS — R682 Dry mouth, unspecified: Secondary | ICD-10-CM | POA: Diagnosis not present

## 2018-02-18 DIAGNOSIS — B370 Candidal stomatitis: Secondary | ICD-10-CM | POA: Diagnosis not present

## 2018-02-18 DIAGNOSIS — I10 Essential (primary) hypertension: Secondary | ICD-10-CM | POA: Diagnosis not present

## 2018-02-19 DIAGNOSIS — I10 Essential (primary) hypertension: Secondary | ICD-10-CM | POA: Diagnosis not present

## 2018-02-19 DIAGNOSIS — G8929 Other chronic pain: Secondary | ICD-10-CM | POA: Diagnosis not present

## 2018-02-19 DIAGNOSIS — M5442 Lumbago with sciatica, left side: Secondary | ICD-10-CM | POA: Diagnosis not present

## 2018-02-19 DIAGNOSIS — R8290 Unspecified abnormal findings in urine: Secondary | ICD-10-CM | POA: Diagnosis not present

## 2018-02-26 DIAGNOSIS — I10 Essential (primary) hypertension: Secondary | ICD-10-CM | POA: Diagnosis not present

## 2018-02-26 DIAGNOSIS — K449 Diaphragmatic hernia without obstruction or gangrene: Secondary | ICD-10-CM | POA: Diagnosis not present

## 2018-02-26 DIAGNOSIS — K5730 Diverticulosis of large intestine without perforation or abscess without bleeding: Secondary | ICD-10-CM | POA: Diagnosis not present

## 2018-02-26 DIAGNOSIS — K146 Glossodynia: Secondary | ICD-10-CM | POA: Diagnosis not present

## 2018-02-26 DIAGNOSIS — Z0000 Encounter for general adult medical examination without abnormal findings: Secondary | ICD-10-CM | POA: Diagnosis not present

## 2018-04-08 DIAGNOSIS — N343 Urethral syndrome, unspecified: Secondary | ICD-10-CM | POA: Diagnosis not present

## 2018-04-08 DIAGNOSIS — I10 Essential (primary) hypertension: Secondary | ICD-10-CM | POA: Diagnosis not present

## 2018-04-08 DIAGNOSIS — R8290 Unspecified abnormal findings in urine: Secondary | ICD-10-CM | POA: Diagnosis not present

## 2018-04-08 DIAGNOSIS — R300 Dysuria: Secondary | ICD-10-CM | POA: Diagnosis not present

## 2018-05-03 IMAGING — CT CT CERVICAL SPINE W/O CM
5 of 10 series · 10 of 34 positions shown, 11 images · non-contrast
Comparison: None.

CLINICAL DATA: Patient states that she was stepping off of a curb
about [DATE] tonight and fell. Patient states that she hit her face.
Patient denies LOC and denies taking blood thinners. Patient states
that states that it broke her dentures and is complaining of
bilateral jaw pain

EXAM:
CT HEAD WITHOUT CONTRAST
CT MAXILLOFACIAL WITHOUT CONTRAST
CT CERVICAL SPINE WITHOUT CONTRAST
TECHNIQUE: Multidetector CT imaging of the head, cervical spine, and
maxillofacial structures were performed using the standard protocol
without intravenous contrast. Multiplanar CT image reconstructions
of the cervical spine and maxillofacial structures were also
generated.

[Series 6: max soft · axial · 0.33mm/px · z∈[-228,-178]mm · 2 of 76 slices shown]
[im 26/76  soft-tissue]
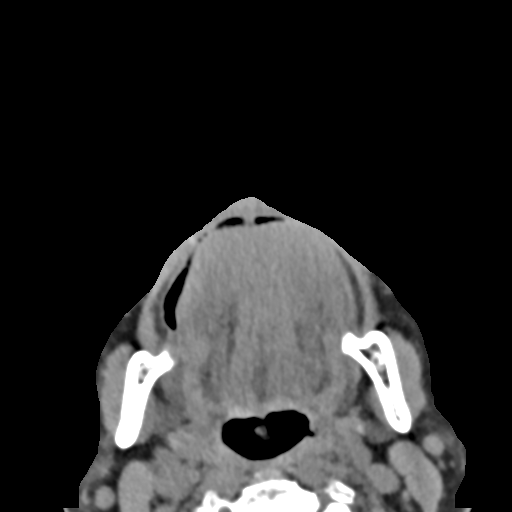
[im 51/76  soft-tissue]
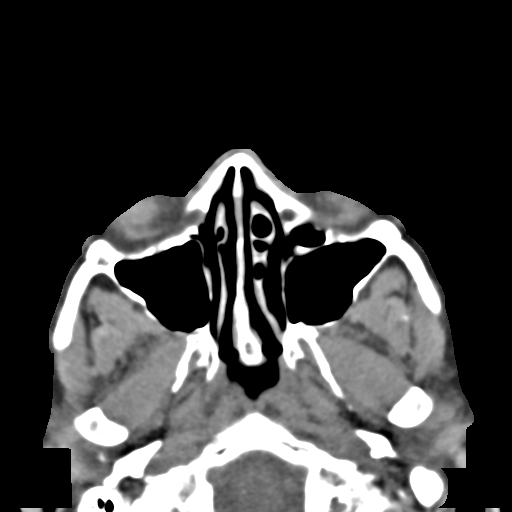

[Series 10: coronal soft · coronal · 0.31mm/px · 1 of 99 slices shown]
[im 50/99  bone]
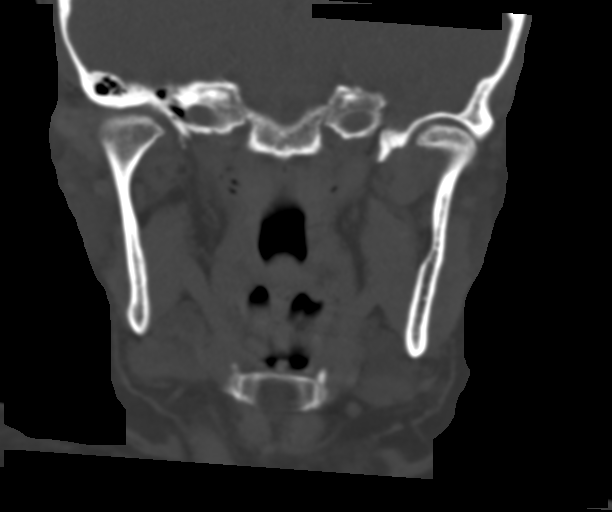

[Series 15: c spine soft · axial · 0.27mm/px · z∈[-267,-213]mm · 2 of 81 slices shown]
[im 27/81  soft-tissue]
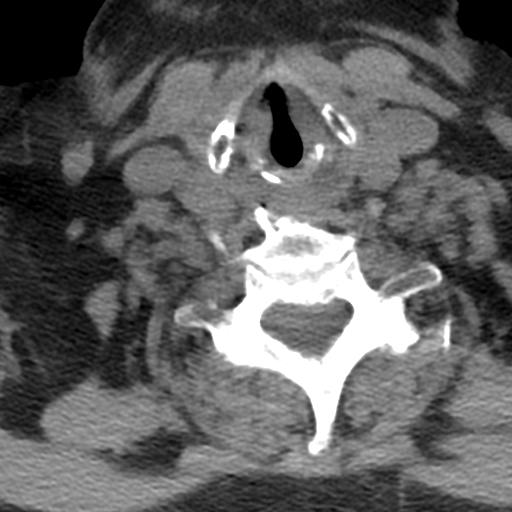
[im 54/81  soft-tissue]
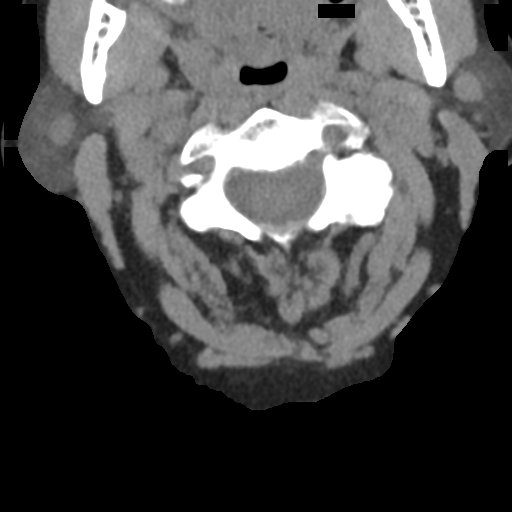

[Series 16: sagittal bone · sagittal · 0.29mm/px · 2 of 51 slices shown]
[im 17/51  bone]
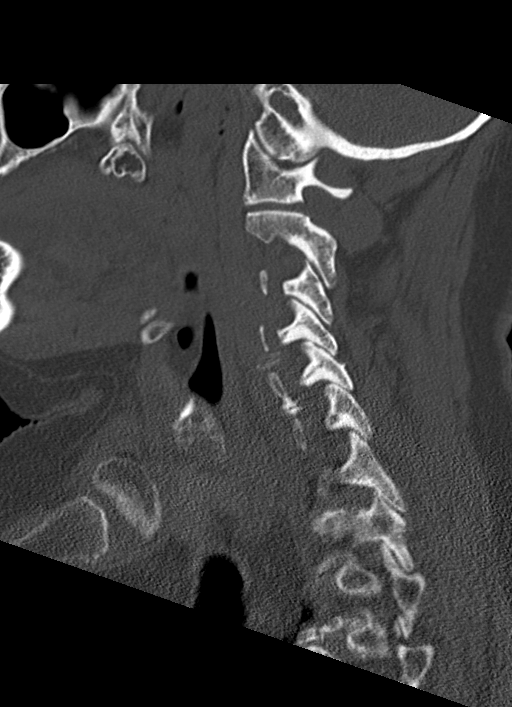
[im 34/51  bone]
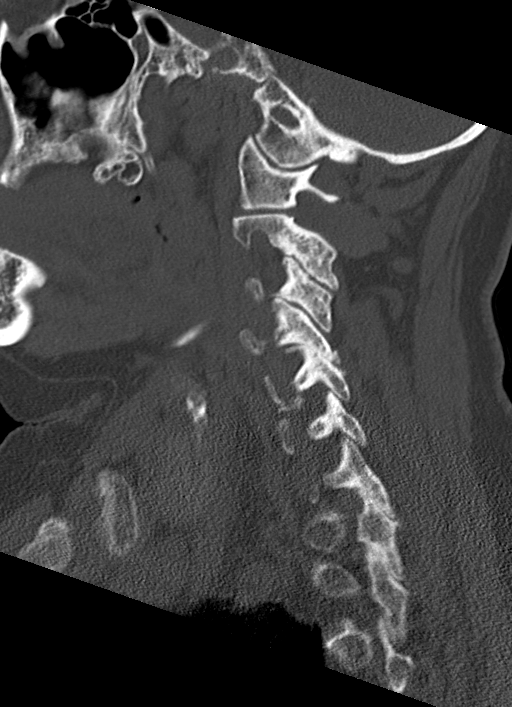

[Series 18: orthogonal axials · axial · 0.23mm/px · z∈[-314,-223]mm · 3 of 100 slices shown, 4 images]
[im 25/100  soft-tissue]
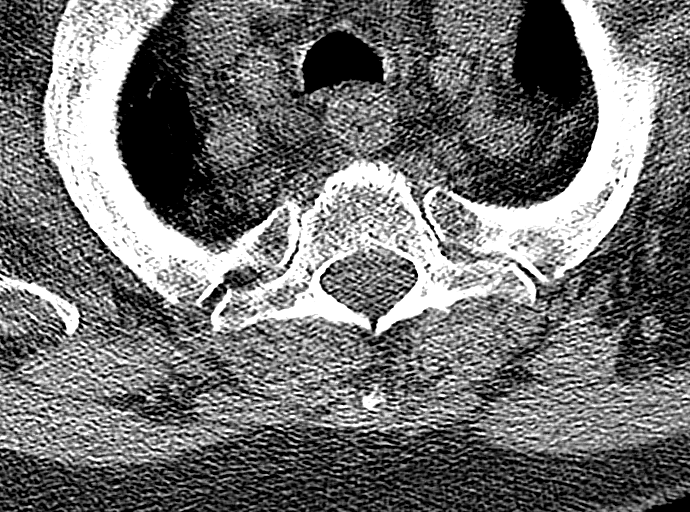
[im 25/100  bone]
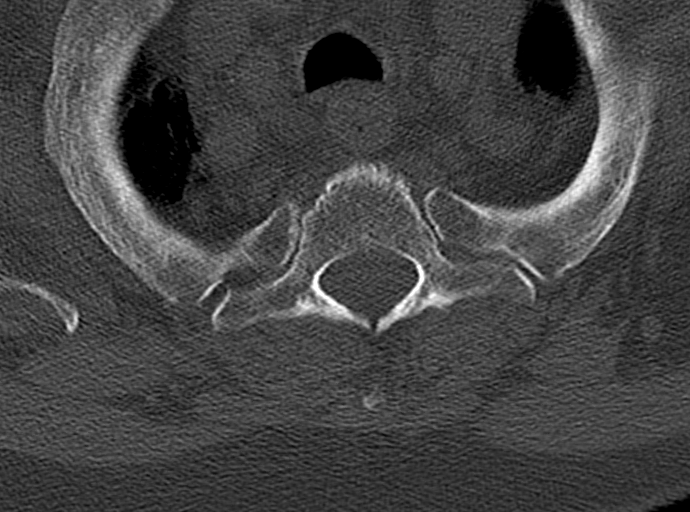
[im 50/100  bone]
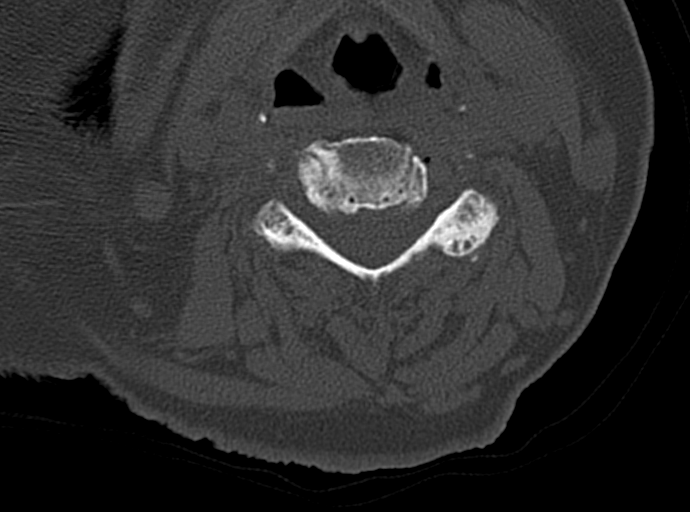
[im 75/100  bone]
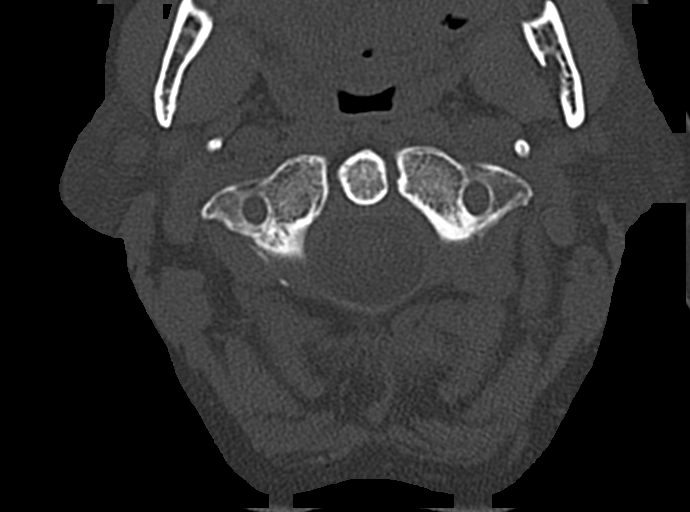

[10 of 34 positions shown; findings below may reference images not displayed]

FINDINGS: CT HEAD FINDINGS

Brain: No evidence of acute infarction, hemorrhage, hydrocephalus,
extra-axial collection or mass lesion/mass effect.

Vascular: No hyperdense vessel or unexpected calcification.

Skull: Normal. Negative for fracture or focal lesion.

Other: None.

CT MAXILLOFACIAL FINDINGS

Osseous: No acute fracture.  No bone lesion.

Orbits: Negative. No traumatic or inflammatory finding.

Sinuses: Clear.

Soft tissues: No obvious soft tissue contusion or hematoma. No soft
tissue mass or adenopathy.

CT CERVICAL SPINE FINDINGS

Alignment: Mild kyphosis of the lower cervical spine. No
spondylolisthesis.

Skull base and vertebrae: No acute fracture. No primary bone lesion
or focal pathologic process.

Soft tissues and spinal canal: No prevertebral fluid or swelling. No
visible canal hematoma.

Disc levels: Marked loss of disc height with endplate sclerosis and
osteophytes at C3-C4. Moderate loss of disc height at C4-C5, C5-C6,
C6-C7 and C7-T1. Spondylotic disc bulging with endplate spurring is
noted at all of these levels. There are facet degenerative changes
that are greatest on the left from C2-C3 through C4-C5. No
convincing disc herniation.

Upper chest: No acute findings. No masses. Lung apices show
emphysema and mild scarring.

Other: None.
IMPRESSION: HEAD CT

1. No intracranial abnormality.
2. No skull fracture.

MAXILLOFACIAL CT

1. No fracture or acute finding.

CERVICAL CT

1. No fracture or acute finding.

## 2018-06-05 DIAGNOSIS — N3945 Continuous leakage: Secondary | ICD-10-CM | POA: Diagnosis not present

## 2018-06-05 DIAGNOSIS — I10 Essential (primary) hypertension: Secondary | ICD-10-CM | POA: Diagnosis not present

## 2018-06-06 DIAGNOSIS — N3945 Continuous leakage: Secondary | ICD-10-CM | POA: Diagnosis not present

## 2018-06-06 DIAGNOSIS — I10 Essential (primary) hypertension: Secondary | ICD-10-CM | POA: Diagnosis not present

## 2018-07-10 DIAGNOSIS — H353223 Exudative age-related macular degeneration, left eye, with inactive scar: Secondary | ICD-10-CM | POA: Diagnosis not present

## 2018-08-25 DIAGNOSIS — Z0000 Encounter for general adult medical examination without abnormal findings: Secondary | ICD-10-CM | POA: Diagnosis not present

## 2018-08-25 DIAGNOSIS — I10 Essential (primary) hypertension: Secondary | ICD-10-CM | POA: Diagnosis not present

## 2018-08-25 DIAGNOSIS — K146 Glossodynia: Secondary | ICD-10-CM | POA: Diagnosis not present

## 2018-08-25 DIAGNOSIS — K449 Diaphragmatic hernia without obstruction or gangrene: Secondary | ICD-10-CM | POA: Diagnosis not present

## 2018-08-25 DIAGNOSIS — K5730 Diverticulosis of large intestine without perforation or abscess without bleeding: Secondary | ICD-10-CM | POA: Diagnosis not present

## 2018-08-27 DIAGNOSIS — I10 Essential (primary) hypertension: Secondary | ICD-10-CM | POA: Diagnosis not present

## 2018-08-27 DIAGNOSIS — N3945 Continuous leakage: Secondary | ICD-10-CM | POA: Diagnosis not present

## 2018-08-27 DIAGNOSIS — M5431 Sciatica, right side: Secondary | ICD-10-CM | POA: Diagnosis not present

## 2018-08-27 DIAGNOSIS — R3129 Other microscopic hematuria: Secondary | ICD-10-CM | POA: Diagnosis not present

## 2018-09-23 ENCOUNTER — Emergency Department
Admission: EM | Admit: 2018-09-23 | Discharge: 2018-09-23 | Disposition: A | Discharge status: Home / Self Care | Payer: PPO | Attending: Emergency Medicine | Admitting: Emergency Medicine

## 2018-09-23 ENCOUNTER — Encounter: Payer: Self-pay | Admitting: Emergency Medicine

## 2018-09-23 DIAGNOSIS — Y999 Unspecified external cause status: Secondary | ICD-10-CM | POA: Insufficient documentation

## 2018-09-23 DIAGNOSIS — W2209XA Striking against other stationary object, initial encounter: Secondary | ICD-10-CM | POA: Diagnosis not present

## 2018-09-23 DIAGNOSIS — S80812A Abrasion, left lower leg, initial encounter: Secondary | ICD-10-CM | POA: Diagnosis not present

## 2018-09-23 DIAGNOSIS — Y929 Unspecified place or not applicable: Secondary | ICD-10-CM | POA: Diagnosis not present

## 2018-09-23 DIAGNOSIS — I10 Essential (primary) hypertension: Secondary | ICD-10-CM | POA: Insufficient documentation

## 2018-09-23 DIAGNOSIS — T148XXA Other injury of unspecified body region, initial encounter: Secondary | ICD-10-CM

## 2018-09-23 DIAGNOSIS — Y9301 Activity, walking, marching and hiking: Secondary | ICD-10-CM | POA: Diagnosis not present

## 2018-09-23 DIAGNOSIS — S81812A Laceration without foreign body, left lower leg, initial encounter: Secondary | ICD-10-CM | POA: Diagnosis not present

## 2018-09-23 NOTE — ED Triage Notes (Signed)
Pt hit left lower leg on box and has skin tears to the area that pt is concerned due to the bleeding. New bandage applied, slight bleeding noted. Pt denies blood thinners.

## 2018-09-23 NOTE — ED Provider Notes (Signed)
[facility] Emergency Department Provider Note  ____________________________________________  Time seen: Approximately 11:22 PM  I have reviewed the triage vital signs and the nursing notes.   HISTORY  Chief Complaint Laceration    HPI Alexis Neal is a 82 y.o. female presents to the emergency department with abrasions of the left lower leg after patient reports that she ran into a box.  Patient had difficulty achieving hemostasis at home.  Patient denies blood thinner use.  Her tetanus status is up-to-date.   Past Medical History:  Diagnosis Date  . Female bladder prolapse   . Hypertension   . Nerve compression     There are no active problems to display for this patient.   Past Surgical History:  Procedure Laterality Date  . ABDOMINAL HYSTERECTOMY      Prior to Admission medications   Medication Sig Start Date End Date Taking? Authorizing Provider  ondansetron (ZOFRAN ODT) 4 MG disintegrating tablet Take 1 tablet (4 mg total) by mouth every 8 (eight) hours as needed for nausea or vomiting. 12/22/17   Paulette Blanch, MD  oxyCODONE-acetaminophen (ROXICET) 5-325 MG per tablet Take 1 tablet by mouth every 4 (four) hours as needed for severe pain. 05/29/15   Gregor Hams, MD  traMADol (ULTRAM) 50 MG tablet Take 1 tablet (50 mg total) by mouth every 6 (six) hours as needed. 12/22/17   Paulette Blanch, MD    Allergies Patient has no known allergies.  History reviewed. No pertinent family history.  Social History Social History   Tobacco Use  . Smoking status: Never Smoker  . Smokeless tobacco: Never Used  Substance Use Topics  . Alcohol use: No  . Drug use: No     Review of Systems  Constitutional: No fever/chills Eyes: No visual changes. No discharge ENT: No upper respiratory complaints. Cardiovascular: no chest pain. Respiratory: no cough. No SOB. Gastrointestinal: No abdominal pain.  No nausea, no vomiting.  No diarrhea.  No  constipation. Musculoskeletal: Negative for musculoskeletal pain. Skin: Patient has abrasions of left lower leg. Neurological: Negative for headaches, focal weakness or numbness.   ____________________________________________   PHYSICAL EXAM:  VITAL SIGNS: ED Triage Vitals  Enc Vitals Group     BP 09/23/18 1924 (!) 176/83     Pulse Rate 09/23/18 1924 (!) 53     Resp 09/23/18 1924 16     Temp 09/23/18 1924 98.6 F (37 C)     Temp Source 09/23/18 1924 Oral     SpO2 09/23/18 1924 98 %     Weight --      Height --      Head Circumference --      Peak Flow --      Pain Score 09/23/18 2100 4     Pain Loc --      Pain Edu? --      Excl. in [place]? --      Constitutional: Alert and oriented. Well appearing and in no acute distress. Eyes: Conjunctivae are normal. PERRL. EOMI. Head: Atraumatic.  Cardiovascular: Normal rate, regular rhythm. Normal S1 and S2.  Good peripheral circulation. Respiratory: Normal respiratory effort without tachypnea or retractions. Lungs CTAB. Good air entry to the bases with no decreased or absent breath sounds. Musculoskeletal: Full range of motion to all extremities. No gross deformities appreciated. Neurologic:  Normal speech and language. No gross focal neurologic deficits are appreciated.  Skin: Patient has 3 small abrasions of left lower leg. Psychiatric: Mood and  affect are normal. Speech and behavior are normal. Patient exhibits appropriate insight and judgement.   ____________________________________________   LABS (all labs ordered are listed, but only abnormal results are displayed)  Labs Reviewed - No data to display ____________________________________________  EKG   ____________________________________________  RADIOLOGY   No results found.  ____________________________________________    PROCEDURES  Procedure(s) performed:    Procedures  LACERATION REPAIR Performed by: Lannie Fields Authorized by: Lannie Fields Consent: Verbal consent obtained. Risks and benefits: risks, benefits and alternatives were discussed Consent given by: patient Patient identity confirmed: provided demographic data Prepped and Draped in normal sterile fashion  Irrigation method: syringe Amount of cleaning: 500 cc normal saline.   Skin closure: Dermabond  Patient tolerance: Patient tolerated the procedure well with no immediate complications.   Medications - No data to display   ____________________________________________   INITIAL IMPRESSION / ASSESSMENT AND PLAN / ED COURSE  Pertinent labs & imaging results that were available during my care of the patient were reviewed by me and considered in my medical decision making (see chart for details).  Review of the [place] CSRS was performed in accordance of the [place] prior to dispensing any controlled drugs.      Assessment and plan Abrasions Patient presents to the emergency department with abrasions of the left lower leg repaired in the emergency department with Dermabond.  Patient was advised to follow-up with primary care as needed.  All patient questions were answered.     ____________________________________________  FINAL CLINICAL IMPRESSION(S) / ED DIAGNOSES  Final diagnoses:  Skin abrasion      NEW MEDICATIONS STARTED DURING THIS VISIT:  ED Discharge Orders    None          This chart was dictated using voice recognition software/Dragon. Despite best efforts to proofread, errors can occur which can change the meaning. Any change was purely unintentional.    Lannie Fields, PA-C 09/23/18 2325    Eula Listen, MD 09/24/18 [phone]

## 2018-09-29 DIAGNOSIS — S80812A Abrasion, left lower leg, initial encounter: Secondary | ICD-10-CM | POA: Diagnosis not present

## 2018-12-25 DIAGNOSIS — I10 Essential (primary) hypertension: Secondary | ICD-10-CM | POA: Diagnosis not present

## 2018-12-25 DIAGNOSIS — N3945 Continuous leakage: Secondary | ICD-10-CM | POA: Diagnosis not present

## 2018-12-25 DIAGNOSIS — M5431 Sciatica, right side: Secondary | ICD-10-CM | POA: Diagnosis not present

## 2019-01-01 DIAGNOSIS — M5416 Radiculopathy, lumbar region: Secondary | ICD-10-CM | POA: Diagnosis not present

## 2019-01-01 DIAGNOSIS — Z0000 Encounter for general adult medical examination without abnormal findings: Secondary | ICD-10-CM | POA: Diagnosis not present

## 2019-01-01 DIAGNOSIS — K449 Diaphragmatic hernia without obstruction or gangrene: Secondary | ICD-10-CM | POA: Diagnosis not present

## 2019-01-01 DIAGNOSIS — K5900 Constipation, unspecified: Secondary | ICD-10-CM | POA: Diagnosis not present

## 2019-01-01 DIAGNOSIS — I10 Essential (primary) hypertension: Secondary | ICD-10-CM | POA: Diagnosis not present

## 2019-02-25 DIAGNOSIS — K5730 Diverticulosis of large intestine without perforation or abscess without bleeding: Secondary | ICD-10-CM | POA: Diagnosis not present

## 2019-02-25 DIAGNOSIS — L739 Follicular disorder, unspecified: Secondary | ICD-10-CM | POA: Diagnosis not present

## 2019-02-25 DIAGNOSIS — I10 Essential (primary) hypertension: Secondary | ICD-10-CM | POA: Diagnosis not present

## 2019-04-21 DIAGNOSIS — H353112 Nonexudative age-related macular degeneration, right eye, intermediate dry stage: Secondary | ICD-10-CM | POA: Diagnosis not present

## 2019-04-28 DIAGNOSIS — K5900 Constipation, unspecified: Secondary | ICD-10-CM | POA: Diagnosis not present

## 2019-04-28 DIAGNOSIS — K449 Diaphragmatic hernia without obstruction or gangrene: Secondary | ICD-10-CM | POA: Diagnosis not present

## 2019-04-28 DIAGNOSIS — M5416 Radiculopathy, lumbar region: Secondary | ICD-10-CM | POA: Diagnosis not present

## 2019-04-28 DIAGNOSIS — R8290 Unspecified abnormal findings in urine: Secondary | ICD-10-CM | POA: Diagnosis not present

## 2019-04-28 DIAGNOSIS — I10 Essential (primary) hypertension: Secondary | ICD-10-CM | POA: Diagnosis not present

## 2019-05-01 DIAGNOSIS — H26491 Other secondary cataract, right eye: Secondary | ICD-10-CM | POA: Diagnosis not present

## 2019-05-05 DIAGNOSIS — Z0000 Encounter for general adult medical examination without abnormal findings: Secondary | ICD-10-CM | POA: Diagnosis not present

## 2019-05-05 DIAGNOSIS — M5442 Lumbago with sciatica, left side: Secondary | ICD-10-CM | POA: Diagnosis not present

## 2019-05-05 DIAGNOSIS — K5900 Constipation, unspecified: Secondary | ICD-10-CM | POA: Diagnosis not present

## 2019-05-05 DIAGNOSIS — I10 Essential (primary) hypertension: Secondary | ICD-10-CM | POA: Diagnosis not present

## 2019-05-05 DIAGNOSIS — G8929 Other chronic pain: Secondary | ICD-10-CM | POA: Diagnosis not present

## 2019-05-05 DIAGNOSIS — N39498 Other specified urinary incontinence: Secondary | ICD-10-CM | POA: Diagnosis not present

## 2019-08-31 DIAGNOSIS — H02056 Trichiasis without entropian left eye, unspecified eyelid: Secondary | ICD-10-CM | POA: Diagnosis not present

## 2019-09-01 DIAGNOSIS — G8929 Other chronic pain: Secondary | ICD-10-CM | POA: Diagnosis not present

## 2019-09-01 DIAGNOSIS — M5442 Lumbago with sciatica, left side: Secondary | ICD-10-CM | POA: Diagnosis not present

## 2019-09-01 DIAGNOSIS — Z0000 Encounter for general adult medical examination without abnormal findings: Secondary | ICD-10-CM | POA: Diagnosis not present

## 2019-09-01 DIAGNOSIS — I10 Essential (primary) hypertension: Secondary | ICD-10-CM | POA: Diagnosis not present

## 2019-09-01 DIAGNOSIS — K5900 Constipation, unspecified: Secondary | ICD-10-CM | POA: Diagnosis not present

## 2019-09-01 DIAGNOSIS — N39498 Other specified urinary incontinence: Secondary | ICD-10-CM | POA: Diagnosis not present

## 2019-09-02 DIAGNOSIS — M5442 Lumbago with sciatica, left side: Secondary | ICD-10-CM | POA: Diagnosis not present

## 2019-09-02 DIAGNOSIS — G8929 Other chronic pain: Secondary | ICD-10-CM | POA: Diagnosis not present

## 2019-09-02 DIAGNOSIS — N39498 Other specified urinary incontinence: Secondary | ICD-10-CM | POA: Diagnosis not present

## 2019-09-02 DIAGNOSIS — I10 Essential (primary) hypertension: Secondary | ICD-10-CM | POA: Diagnosis not present

## 2019-09-02 DIAGNOSIS — Z0000 Encounter for general adult medical examination without abnormal findings: Secondary | ICD-10-CM | POA: Diagnosis not present

## 2019-09-02 DIAGNOSIS — K5900 Constipation, unspecified: Secondary | ICD-10-CM | POA: Diagnosis not present

## 2019-09-04 DIAGNOSIS — M79605 Pain in left leg: Secondary | ICD-10-CM | POA: Diagnosis not present

## 2019-09-04 DIAGNOSIS — R202 Paresthesia of skin: Secondary | ICD-10-CM | POA: Diagnosis not present

## 2019-09-04 DIAGNOSIS — M79604 Pain in right leg: Secondary | ICD-10-CM | POA: Diagnosis not present

## 2019-09-04 DIAGNOSIS — K449 Diaphragmatic hernia without obstruction or gangrene: Secondary | ICD-10-CM | POA: Diagnosis not present

## 2019-09-04 DIAGNOSIS — K5900 Constipation, unspecified: Secondary | ICD-10-CM | POA: Diagnosis not present

## 2019-09-04 DIAGNOSIS — I10 Essential (primary) hypertension: Secondary | ICD-10-CM | POA: Diagnosis not present

## 2019-10-02 ENCOUNTER — Other Ambulatory Visit: Payer: Self-pay

## 2019-10-02 DIAGNOSIS — Z20822 Contact with and (suspected) exposure to covid-19: Secondary | ICD-10-CM

## 2019-10-05 ENCOUNTER — Telehealth: Payer: Self-pay | Admitting: General Practice

## 2019-10-05 LAB — NOVEL CORONAVIRUS, NAA: SARS-CoV-2, NAA: NOT DETECTED

## 2019-10-05 NOTE — Telephone Encounter (Signed)
Negative COVID results given. Patient results "NOT Detected." Caller expressed understanding. ° °

## 2019-11-19 DIAGNOSIS — J019 Acute sinusitis, unspecified: Secondary | ICD-10-CM | POA: Diagnosis not present

## 2019-11-19 DIAGNOSIS — J029 Acute pharyngitis, unspecified: Secondary | ICD-10-CM | POA: Diagnosis not present

## 2020-01-06 DIAGNOSIS — F330 Major depressive disorder, recurrent, mild: Secondary | ICD-10-CM | POA: Diagnosis not present

## 2020-01-06 DIAGNOSIS — K449 Diaphragmatic hernia without obstruction or gangrene: Secondary | ICD-10-CM | POA: Diagnosis not present

## 2020-01-06 DIAGNOSIS — F411 Generalized anxiety disorder: Secondary | ICD-10-CM | POA: Diagnosis not present

## 2020-01-06 DIAGNOSIS — Z0000 Encounter for general adult medical examination without abnormal findings: Secondary | ICD-10-CM | POA: Diagnosis not present

## 2020-01-06 DIAGNOSIS — C44300 Unspecified malignant neoplasm of skin of unspecified part of face: Secondary | ICD-10-CM | POA: Diagnosis not present

## 2020-01-06 DIAGNOSIS — K5900 Constipation, unspecified: Secondary | ICD-10-CM | POA: Diagnosis not present

## 2020-01-06 DIAGNOSIS — L853 Xerosis cutis: Secondary | ICD-10-CM | POA: Diagnosis not present

## 2020-01-06 DIAGNOSIS — H0263 Xanthelasma of right eye, unspecified eyelid: Secondary | ICD-10-CM | POA: Diagnosis not present

## 2020-01-06 DIAGNOSIS — I10 Essential (primary) hypertension: Secondary | ICD-10-CM | POA: Diagnosis not present

## 2020-01-11 DIAGNOSIS — M25511 Pain in right shoulder: Secondary | ICD-10-CM | POA: Diagnosis not present

## 2020-02-26 DIAGNOSIS — H353112 Nonexudative age-related macular degeneration, right eye, intermediate dry stage: Secondary | ICD-10-CM | POA: Diagnosis not present

## 2020-05-03 DIAGNOSIS — L853 Xerosis cutis: Secondary | ICD-10-CM | POA: Diagnosis not present

## 2020-05-03 DIAGNOSIS — K449 Diaphragmatic hernia without obstruction or gangrene: Secondary | ICD-10-CM | POA: Diagnosis not present

## 2020-05-03 DIAGNOSIS — F411 Generalized anxiety disorder: Secondary | ICD-10-CM | POA: Diagnosis not present

## 2020-05-03 DIAGNOSIS — C44300 Unspecified malignant neoplasm of skin of unspecified part of face: Secondary | ICD-10-CM | POA: Diagnosis not present

## 2020-05-03 DIAGNOSIS — K5900 Constipation, unspecified: Secondary | ICD-10-CM | POA: Diagnosis not present

## 2020-05-03 DIAGNOSIS — I10 Essential (primary) hypertension: Secondary | ICD-10-CM | POA: Diagnosis not present

## 2020-05-10 DIAGNOSIS — K5900 Constipation, unspecified: Secondary | ICD-10-CM | POA: Diagnosis not present

## 2020-05-10 DIAGNOSIS — M170 Bilateral primary osteoarthritis of knee: Secondary | ICD-10-CM | POA: Diagnosis not present

## 2020-05-10 DIAGNOSIS — N289 Disorder of kidney and ureter, unspecified: Secondary | ICD-10-CM | POA: Diagnosis not present

## 2020-05-10 DIAGNOSIS — I10 Essential (primary) hypertension: Secondary | ICD-10-CM | POA: Diagnosis not present

## 2020-05-10 DIAGNOSIS — Z0000 Encounter for general adult medical examination without abnormal findings: Secondary | ICD-10-CM | POA: Diagnosis not present

## 2020-05-10 DIAGNOSIS — J019 Acute sinusitis, unspecified: Secondary | ICD-10-CM | POA: Diagnosis not present

## 2020-05-31 DIAGNOSIS — M170 Bilateral primary osteoarthritis of knee: Secondary | ICD-10-CM | POA: Diagnosis not present

## 2020-05-31 DIAGNOSIS — M25561 Pain in right knee: Secondary | ICD-10-CM | POA: Diagnosis not present

## 2020-05-31 DIAGNOSIS — M25562 Pain in left knee: Secondary | ICD-10-CM | POA: Diagnosis not present

## 2020-05-31 DIAGNOSIS — G8929 Other chronic pain: Secondary | ICD-10-CM | POA: Diagnosis not present

## 2020-08-25 DIAGNOSIS — H353112 Nonexudative age-related macular degeneration, right eye, intermediate dry stage: Secondary | ICD-10-CM | POA: Diagnosis not present

## 2020-08-25 DIAGNOSIS — H02054 Trichiasis without entropian left upper eyelid: Secondary | ICD-10-CM | POA: Diagnosis not present

## 2020-09-08 DIAGNOSIS — Z0000 Encounter for general adult medical examination without abnormal findings: Secondary | ICD-10-CM | POA: Diagnosis not present

## 2020-09-08 DIAGNOSIS — N289 Disorder of kidney and ureter, unspecified: Secondary | ICD-10-CM | POA: Diagnosis not present

## 2020-09-08 DIAGNOSIS — J019 Acute sinusitis, unspecified: Secondary | ICD-10-CM | POA: Diagnosis not present

## 2020-09-08 DIAGNOSIS — K5900 Constipation, unspecified: Secondary | ICD-10-CM | POA: Diagnosis not present

## 2020-09-08 DIAGNOSIS — I10 Essential (primary) hypertension: Secondary | ICD-10-CM | POA: Diagnosis not present

## 2020-09-08 DIAGNOSIS — M170 Bilateral primary osteoarthritis of knee: Secondary | ICD-10-CM | POA: Diagnosis not present

## 2020-09-08 DIAGNOSIS — R7309 Other abnormal glucose: Secondary | ICD-10-CM | POA: Diagnosis not present

## 2020-09-13 DIAGNOSIS — R7309 Other abnormal glucose: Secondary | ICD-10-CM | POA: Diagnosis not present

## 2020-09-13 DIAGNOSIS — I10 Essential (primary) hypertension: Secondary | ICD-10-CM | POA: Diagnosis not present

## 2020-09-13 DIAGNOSIS — M170 Bilateral primary osteoarthritis of knee: Secondary | ICD-10-CM | POA: Diagnosis not present

## 2020-09-13 DIAGNOSIS — D649 Anemia, unspecified: Secondary | ICD-10-CM | POA: Diagnosis not present

## 2020-09-13 DIAGNOSIS — H0263 Xanthelasma of right eye, unspecified eyelid: Secondary | ICD-10-CM | POA: Diagnosis not present

## 2020-09-13 DIAGNOSIS — J302 Other seasonal allergic rhinitis: Secondary | ICD-10-CM | POA: Diagnosis not present

## 2020-09-13 DIAGNOSIS — N3945 Continuous leakage: Secondary | ICD-10-CM | POA: Diagnosis not present

## 2020-11-17 ENCOUNTER — Other Ambulatory Visit: Payer: Self-pay

## 2020-11-17 ENCOUNTER — Encounter: Payer: Self-pay | Admitting: Dermatology

## 2020-11-17 ENCOUNTER — Ambulatory Visit: Payer: PPO | Admitting: Dermatology

## 2020-11-17 DIAGNOSIS — L720 Epidermal cyst: Secondary | ICD-10-CM | POA: Diagnosis not present

## 2020-11-17 DIAGNOSIS — L2081 Atopic neurodermatitis: Secondary | ICD-10-CM | POA: Diagnosis not present

## 2020-11-17 DIAGNOSIS — L570 Actinic keratosis: Secondary | ICD-10-CM

## 2020-11-17 DIAGNOSIS — Z85828 Personal history of other malignant neoplasm of skin: Secondary | ICD-10-CM | POA: Diagnosis not present

## 2020-11-17 DIAGNOSIS — L578 Other skin changes due to chronic exposure to nonionizing radiation: Secondary | ICD-10-CM | POA: Diagnosis not present

## 2020-11-17 DIAGNOSIS — L853 Xerosis cutis: Secondary | ICD-10-CM

## 2020-11-17 MED ORDER — MOMETASONE FUROATE 0.1 % EX CREA: 1.0000 "application " | TOPICAL_CREAM | CUTANEOUS | 3 refills | Status: DC

## 2020-11-17 NOTE — Patient Instructions (Signed)
Start Dove for sensitive skin soap Start Cerave cream to body

## 2020-11-17 NOTE — Progress Notes (Signed)
   New Patient Visit  Subjective  Alexis Neal is a 85 y.o. female who presents for the following: check spot (R nose ~70yrs,/R infraocular ~15 yrs/Hx of BCC R arm, L eyelid).  The following portions of the chart were reviewed this encounter and updated as appropriate:   Tobacco  Allergies  Meds  Problems  Med Hx  Surg Hx  Fam Hx     Review of Systems:  No other skin or systemic complaints except as noted in HPI or Assessment and Plan.  Objective  Well appearing patient in no apparent distress; mood and affect are within normal limits.  A focused examination was performed including face, back. Relevant physical exam findings are noted in the Assessment and Plan.  Objective  Right lower eyelid: 0.8 x 0.5cm cystic pap  Objective  Mid Back: Scaly erythematous papules and patches +/- dyspigmentation, lichenification, excoriations.   Objective  Back: xerosis  Objective  R nose x 1: Pink scaly macules    Assessment & Plan    Actinic Damage - chronic, secondary to cumulative UV radiation exposure/sun exposure over time - diffuse scaly erythematous macules with underlying dyspigmentation - Recommend daily broad spectrum sunscreen SPF 30+ to sun-exposed areas, reapply every 2 hours as needed.  - Call for new or changing lesions.  History of Basal Cell Carcinoma of the Skin - No evidence of recurrence today - Recommend regular full body skin exams - Recommend daily broad spectrum sunscreen SPF 30+ to sun-exposed areas, reapply every 2 hours as needed.  - Call if any new or changing lesions are noted between office visits  Epidermal cyst Right lower eyelid 0.8x0.5 cm Benign, observe Discussed excising, pt declines  Atopic neurodermatitis with pruritus Mid Back /Eczema Atopic dermatitis (eczema) is a chronic, relapsing, pruritic condition that can significantly affect quality of life. It is often associated with allergic rhinitis and/or asthma and can require  treatment with topical medications, phototherapy, or in severe cases a biologic medication called Dupixent in older children and adults.    Start Mometasone cream qd up to 5d/wk  mometasone (ELOCON) 0.1 % cream - Mid Back  Xerosis cutis Back Start Dove for Sensitive skin, sample given Start Cerave cream qd, sample given  AK (actinic keratosis) R nose x 1  Destruction of lesion - R nose x 1 Complexity: simple   Destruction method: cryotherapy   Informed consent: discussed and consent obtained   Timeout:  patient name, date of birth, surgical site, and procedure verified Lesion destroyed using liquid nitrogen: Yes   Region frozen until ice ball extended beyond lesion: Yes   Outcome: patient tolerated procedure well with no complications   Post-procedure details: wound care instructions given    Return in about 3 months (around 02/15/2021) for recheck AK and AD.  I, Ardis Rowan, RMA, am acting as scribe for Armida Sans, MD .  Documentation: I have reviewed the above documentation for accuracy and completeness, and I agree with the above.  Armida Sans, MD

## 2020-11-18 ENCOUNTER — Encounter: Payer: Self-pay | Admitting: Dermatology

## 2021-01-05 DIAGNOSIS — R7309 Other abnormal glucose: Secondary | ICD-10-CM | POA: Diagnosis not present

## 2021-01-05 DIAGNOSIS — I10 Essential (primary) hypertension: Secondary | ICD-10-CM | POA: Diagnosis not present

## 2021-01-05 DIAGNOSIS — H0263 Xanthelasma of right eye, unspecified eyelid: Secondary | ICD-10-CM | POA: Diagnosis not present

## 2021-01-05 DIAGNOSIS — J302 Other seasonal allergic rhinitis: Secondary | ICD-10-CM | POA: Diagnosis not present

## 2021-01-05 DIAGNOSIS — N3945 Continuous leakage: Secondary | ICD-10-CM | POA: Diagnosis not present

## 2021-01-05 DIAGNOSIS — D649 Anemia, unspecified: Secondary | ICD-10-CM | POA: Diagnosis not present

## 2021-01-05 DIAGNOSIS — M170 Bilateral primary osteoarthritis of knee: Secondary | ICD-10-CM | POA: Diagnosis not present

## 2021-01-12 DIAGNOSIS — K5900 Constipation, unspecified: Secondary | ICD-10-CM | POA: Diagnosis not present

## 2021-01-12 DIAGNOSIS — K449 Diaphragmatic hernia without obstruction or gangrene: Secondary | ICD-10-CM | POA: Diagnosis not present

## 2021-01-12 DIAGNOSIS — I10 Essential (primary) hypertension: Secondary | ICD-10-CM | POA: Diagnosis not present

## 2021-01-12 DIAGNOSIS — G608 Other hereditary and idiopathic neuropathies: Secondary | ICD-10-CM | POA: Insufficient documentation

## 2021-01-12 DIAGNOSIS — R7309 Other abnormal glucose: Secondary | ICD-10-CM | POA: Diagnosis not present

## 2021-01-12 DIAGNOSIS — F411 Generalized anxiety disorder: Secondary | ICD-10-CM | POA: Diagnosis not present

## 2021-02-21 DIAGNOSIS — H353112 Nonexudative age-related macular degeneration, right eye, intermediate dry stage: Secondary | ICD-10-CM | POA: Diagnosis not present

## 2021-02-23 ENCOUNTER — Other Ambulatory Visit: Payer: Self-pay

## 2021-02-23 ENCOUNTER — Ambulatory Visit: Payer: PPO | Admitting: Dermatology

## 2021-02-23 DIAGNOSIS — L820 Inflamed seborrheic keratosis: Secondary | ICD-10-CM

## 2021-02-23 DIAGNOSIS — L720 Epidermal cyst: Secondary | ICD-10-CM

## 2021-02-23 DIAGNOSIS — L2081 Atopic neurodermatitis: Secondary | ICD-10-CM

## 2021-02-23 DIAGNOSIS — Z872 Personal history of diseases of the skin and subcutaneous tissue: Secondary | ICD-10-CM | POA: Diagnosis not present

## 2021-02-23 NOTE — Patient Instructions (Addendum)

## 2021-02-23 NOTE — Progress Notes (Signed)
   Follow-Up Visit   Subjective  Alexis Neal is a 85 y.o. female who presents for the following: Eczema (Back, 50m f/u itchy, mometasone cr bid), Actinic Keratosis (R nose, 36m f/u), and check spot (Back, dry feeling not sure how long it has been there).  The following portions of the chart were reviewed this encounter and updated as appropriate:   Tobacco  Allergies  Meds  Problems  Med Hx  Surg Hx  Fam Hx     Review of Systems:  No other skin or systemic complaints except as noted in HPI or Assessment and Plan.  Objective  Well appearing patient in no apparent distress; mood and affect are within normal limits.  A focused examination was performed including face, back. Relevant physical exam findings are noted in the Assessment and Plan.  Objective  back: Clear today  Objective  Right lower eyelid: Cystic pap  Objective  R nose: R nose clear  Objective  Right Lower Back x 1: Erythematous keratotic or waxy stuck-on papule or plaque.    Assessment & Plan  Atopic neurodermatitis back With Pruritis/Eczema Resolved with Mometasone  Atopic dermatitis (eczema) is a chronic, relapsing, pruritic condition that can significantly affect quality of life. It is often associated with allergic rhinitis and/or asthma and can require treatment with topical medications, phototherapy, or in severe cases a biologic medication called Dupixent in older children and adults.    Cont Mometasone cr qd up to 5d/wk prn flares Cont Dove for sensitive skin and moisturizer qd  Other Related Medications mometasone (ELOCON) 0.1 % cream  Epidermal cyst Right lower eyelid Benign, observe Pt declines excision.  History of actinic keratoses R nose Clear, observe  Inflamed seborrheic keratosis Right Lower Back x 1  Destruction of lesion - Right Lower Back x 1 Complexity: simple   Destruction method: cryotherapy   Informed consent: discussed and consent obtained   Timeout:  patient  name, date of birth, surgical site, and procedure verified Lesion destroyed using liquid nitrogen: Yes   Region frozen until ice ball extended beyond lesion: Yes   Outcome: patient tolerated procedure well with no complications   Post-procedure details: wound care instructions given    Return in about 1 year (around 02/23/2022) for UBSE, Hx of BCC, Hx of AKs.   I, Othelia Pulling, RMA, am acting as scribe for Sarina Ser, MD .  Documentation: I have reviewed the above documentation for accuracy and completeness, and I agree with the above.  Sarina Ser, MD

## 2021-02-28 ENCOUNTER — Encounter: Payer: Self-pay | Admitting: Dermatology

## 2021-03-28 ENCOUNTER — Other Ambulatory Visit: Payer: Self-pay

## 2021-03-28 ENCOUNTER — Encounter: Payer: Self-pay | Admitting: Emergency Medicine

## 2021-03-28 DIAGNOSIS — B9789 Other viral agents as the cause of diseases classified elsewhere: Secondary | ICD-10-CM | POA: Diagnosis not present

## 2021-03-28 DIAGNOSIS — I10 Essential (primary) hypertension: Secondary | ICD-10-CM | POA: Insufficient documentation

## 2021-03-28 DIAGNOSIS — Z79899 Other long term (current) drug therapy: Secondary | ICD-10-CM | POA: Diagnosis not present

## 2021-03-28 DIAGNOSIS — J069 Acute upper respiratory infection, unspecified: Secondary | ICD-10-CM | POA: Insufficient documentation

## 2021-03-28 DIAGNOSIS — Z85828 Personal history of other malignant neoplasm of skin: Secondary | ICD-10-CM | POA: Diagnosis not present

## 2021-03-28 DIAGNOSIS — Z87891 Personal history of nicotine dependence: Secondary | ICD-10-CM | POA: Diagnosis not present

## 2021-03-28 DIAGNOSIS — Z20822 Contact with and (suspected) exposure to covid-19: Secondary | ICD-10-CM | POA: Diagnosis not present

## 2021-03-28 DIAGNOSIS — R059 Cough, unspecified: Secondary | ICD-10-CM | POA: Diagnosis not present

## 2021-03-28 NOTE — ED Triage Notes (Signed)
Pt to triage via w/c with no distress noted; st since yesterday having nonprod cough & fever of 99 with runny nose; denies pain or other accomp symptoms

## 2021-03-29 ENCOUNTER — Emergency Department
Admission: EM | Admit: 2021-03-29 | Discharge: 2021-03-29 | Disposition: A | Discharge status: Home / Self Care | Payer: PPO | Attending: Emergency Medicine | Admitting: Emergency Medicine

## 2021-03-29 ENCOUNTER — Emergency Department: Payer: PPO

## 2021-03-29 DIAGNOSIS — R059 Cough, unspecified: Secondary | ICD-10-CM | POA: Diagnosis not present

## 2021-03-29 DIAGNOSIS — J069 Acute upper respiratory infection, unspecified: Secondary | ICD-10-CM

## 2021-03-29 LAB — SARS CORONAVIRUS 2 (TAT 6-24 HRS): SARS Coronavirus 2: NEGATIVE

## 2021-03-29 MED ORDER — BENZONATATE 100 MG PO CAPS: 100.0000 mg | ORAL_CAPSULE | Freq: Three times a day (TID) | ORAL | 0 refills | Status: DC | PRN

## 2021-03-29 MED ORDER — BENZONATATE 100 MG PO CAPS
100.0000 mg | ORAL_CAPSULE | Freq: Once | ORAL | Status: AC
  Administered 2021-03-29: 100 mg via ORAL
  Filled 2021-03-29: qty 1

## 2021-03-29 NOTE — ED Notes (Signed)
Pt presenting to ED with productive cough since yesterday. Unknown sick contact. Denies CP, SOB, n/v/d. Pt resting on stretcher in NAD. Awaiting HCP evaluation

## 2021-03-29 NOTE — Discharge Instructions (Addendum)
You have been seen in the Emergency Department (ED) today for a likely viral illness.  Please drink plenty of clear fluids (water, Gatorade, chicken broth, etc).  You may use Tylenol and/or Motrin according to label instructions.  You can alternate between the two without any side effects.   Please remember to check MyChart for the results of your viral respiratory panel later today or tomorrow.    Please follow up with your doctor as listed above.  Call your doctor or return to the Emergency Department (ED) if you are unable to tolerate fluids due to vomiting, have worsening trouble breathing, become extremely tired or difficult to awaken, or if you develop any other symptoms that concern you.

## 2021-03-29 NOTE — ED Provider Notes (Signed)
[facility] Emergency Department Provider Note  ____________________________________________   Event Date/Time   First MD Initiated Contact with Patient 03/29/21 [phone]     (approximate)  I have reviewed the triage vital signs and the nursing notes.   HISTORY  Chief Complaint Cough    HPI Alexis Neal is a 85 y.o. female who presents for evaluation of cough.  She says she has had a cough for the last 1 to 2 days.  She has not seen her primary care doctor.  Her cough seem to be a little bit worse tonight so she thought she should get checked out.  She says she had a temperature at home of 99 but she does not feel febrile.  She denies chest pain.  Nothing particular makes the cough better or worse.  She is not short of breath except for when she is coughing.  She is fully vaccinated for COVID including a booster shot.  No sore throat except she said it feels a little bit irritated from coughing by now.  She does not feel weak and is ambulating without difficulty.         Past Medical History:  Diagnosis Date  . Female bladder prolapse   . Hx of basal cell carcinoma    R arm, txted by Dr. Sharlett Iles  . Hx of basal cell carcinoma    L upper eyelid txted by Dr. Harless Nakayama  . Hypertension   . Nerve compression     There are no problems to display for this patient.   Past Surgical History:  Procedure Laterality Date  . ABDOMINAL HYSTERECTOMY      Prior to Admission medications   Medication Sig Start Date End Date Taking? Authorizing Provider  benzonatate (TESSALON PERLES) 100 MG capsule Take 1 capsule (100 mg total) by mouth 3 (three) times daily as needed for cough. 03/29/21  Yes Hinda Kehr, MD  fluticasone (FLONASE) 50 MCG/ACT nasal spray Place into both nostrils. 08/14/20   [provider]  gabapentin (NEURONTIN) 100 MG capsule Take by mouth. 05/09/20   [provider]  lisinopril (ZESTRIL) 20 MG tablet Take 20 mg by mouth daily.  10/13/20   [provider]  mometasone (ELOCON) 0.1 % cream Apply 1 application topically as directed. Qd up to 5 days a week aa eczema on back prn flares 11/17/20   Ralene Bathe, MD  ondansetron (ZOFRAN ODT) 4 MG disintegrating tablet Take 1 tablet (4 mg total) by mouth every 8 (eight) hours as needed for nausea or vomiting. 12/22/17   Paulette Blanch, MD  oxyCODONE-acetaminophen (ROXICET) 5-325 MG per tablet Take 1 tablet by mouth every 4 (four) hours as needed for severe pain. 05/29/15   Gregor Hams, MD  traMADol (ULTRAM) 50 MG tablet Take 1 tablet (50 mg total) by mouth every 6 (six) hours as needed. 12/22/17   Paulette Blanch, MD    Allergies Patient has no known allergies.  No family history on file.  Social History Social History   Tobacco Use  . Smoking status: Former Research scientist (life sciences)  . Smokeless tobacco: Never Used  Vaping Use  . Vaping Use: Never used  Substance Use Topics  . Alcohol use: No  . Drug use: No    Review of Systems Constitutional: No fever/chills Eyes: No visual changes. ENT: No sore throat except the coughing is caused some irritation. Cardiovascular: Denies chest pain. Respiratory: Positive for cough.  Denies shortness of breath except when actively coughing.  Gastrointestinal: No abdominal pain.  No nausea, no vomiting.  No diarrhea.  No constipation. Genitourinary: Negative for dysuria. Musculoskeletal: Negative for neck pain.  Negative for back pain. Integumentary: Negative for rash. Neurological: Negative for headaches, focal weakness or numbness.   ____________________________________________   PHYSICAL EXAM:  VITAL SIGNS: ED Triage Vitals  Enc Vitals Group     BP 03/28/21 2256 131/64     Pulse Rate 03/28/21 2256 62     Resp 03/28/21 2256 20     Temp 03/28/21 2256 98.7 F (37.1 C)     Temp Source 03/28/21 2256 Oral     SpO2 03/28/21 2256 96 %     Weight 03/28/21 2217 74.4 kg (164 lb)     Height 03/28/21 2217 1.676 m (5\' 6" )     Head  Circumference --      Peak Flow --      Pain Score 03/28/21 2217 0     Pain Loc --      Pain Edu? --      Excl. in [place]? --     Constitutional: Alert and oriented.  Eyes: Conjunctivae are normal.  Head: Atraumatic. Nose: No congestion/rhinnorhea. Mouth/Throat: Patient is wearing a mask. Neck: No stridor.  No meningeal signs.   Cardiovascular: Normal rate, regular rhythm. Good peripheral circulation. Respiratory: Normal respiratory effort.  No retractions.  Lung sounds are clear to auscultation bilaterally with no wheezing, rales, nor rhonchi.  She does have a frequent cough which causes her to clear her throat. Gastrointestinal: Soft and nontender. No distention.  Musculoskeletal: No lower extremity tenderness nor edema. No gross deformities of extremities. Neurologic:  Normal speech and language. No gross focal neurologic deficits are appreciated.  Skin:  Skin is warm, dry and intact. Psychiatric: Mood and affect are normal. Speech and behavior are normal.  ____________________________________________   LABS (all labs ordered are listed, but only abnormal results are displayed)  Labs Reviewed  SARS CORONAVIRUS 2 (TAT 6-24 HRS)   ____________________________________________  EKG  No indication for emergent EKG ____________________________________________  RADIOLOGY I, Hinda Kehr, personally viewed and evaluated these images (plain radiographs) as part of my medical decision making, as well as reviewing the written report by the radiologist.  ED MD interpretation: No acute abnormalities on chest x-ray  Official radiology report(s): DG Chest 2 View  Result Date: 03/29/2021 CLINICAL DATA:  Cough EXAM: CHEST - 2 VIEW COMPARISON:  12/22/2017 FINDINGS: The heart size and mediastinal contours are within normal limits. Both lungs are clear. The visualized skeletal structures are unremarkable. IMPRESSION: No active cardiopulmonary disease. Electronically Signed   By: Ulyses Jarred  M.D.   On: 03/29/2021 02:15    ____________________________________________   PROCEDURES   Procedure(s) performed (including Critical Care):  Procedures   ____________________________________________   INITIAL IMPRESSION / MDM / [place] / ED COURSE  As part of my medical decision making, I reviewed the following data within the [place] notes reviewed and incorporated, Old chart reviewed, Radiograph reviewed  and Notes from prior ED visits   Differential diagnosis includes, but is not limited to, COVID-19, pneumonia, bronchitis, medication side effect.  Patient is well-appearing in no distress.  In spite of her age she is ambulatory around the emergency department without difficulty.  No difficulty speaking, no respiratory difficulty, clear lungs auscultation.  Patient has a cough and might have a mild viral bronchitis.  I personally reviewed the patient's imaging and agree with the radiologist's interpretation that there is  no evidence of pneumonia on chest x-ray.  Given her age I am reluctant to start multiple new medications such as steroids or albuterol without a very clear indication and will be helpful.  I will prescribe Tessalon and encourage close outpatient follow-up with her primary care doctor later today.  She says that she understands and agrees with the plan.  Additionally, we also discussed a viral respiratory swab including influenza because we have been having some recent cases of flu as well as COVID-19.  She agrees with the plan and she knows she needs to check MyChart for the results later today.  ____________________________________________  FINAL CLINICAL IMPRESSION(S) / ED DIAGNOSES  Final diagnoses:  Viral URI with cough     MEDICATIONS GIVEN DURING THIS VISIT:  Medications  benzonatate (TESSALON) capsule 100 mg (has no administration in time range)     ED Discharge Orders         Ordered    benzonatate  (TESSALON PERLES) 100 MG capsule  3 times daily PRN        03/29/21 0544          *Please note:  BRYANN GENTZ was evaluated in Emergency Department on 03/29/2021 for the symptoms described in the history of present illness. She was evaluated in the context of the global COVID-19 pandemic, which necessitated consideration that the patient might be at risk for infection with the SARS-CoV-2 virus that causes COVID-19. Institutional protocols and algorithms that pertain to the evaluation of patients at risk for COVID-19 are in a state of rapid change based on information released by regulatory bodies including the CDC and federal and state organizations. These policies and algorithms were followed during the patient's care in the ED.  Some ED evaluations and interventions may be delayed as a result of limited staffing during and after the pandemic.*  Note:  This document was prepared using Dragon voice recognition software and may include unintentional dictation errors.   Hinda Kehr, MD 03/29/21 [phone]

## 2021-04-03 DIAGNOSIS — J101 Influenza due to other identified influenza virus with other respiratory manifestations: Secondary | ICD-10-CM | POA: Diagnosis not present

## 2021-04-03 DIAGNOSIS — R053 Chronic cough: Secondary | ICD-10-CM | POA: Diagnosis not present

## 2021-04-03 DIAGNOSIS — R0982 Postnasal drip: Secondary | ICD-10-CM | POA: Diagnosis not present

## 2021-04-03 DIAGNOSIS — F330 Major depressive disorder, recurrent, mild: Secondary | ICD-10-CM | POA: Diagnosis not present

## 2021-04-03 DIAGNOSIS — I10 Essential (primary) hypertension: Secondary | ICD-10-CM | POA: Diagnosis not present

## 2021-04-03 DIAGNOSIS — N1832 Chronic kidney disease, stage 3b: Secondary | ICD-10-CM | POA: Diagnosis not present

## 2021-04-03 DIAGNOSIS — K449 Diaphragmatic hernia without obstruction or gangrene: Secondary | ICD-10-CM | POA: Diagnosis not present

## 2021-04-03 DIAGNOSIS — R509 Fever, unspecified: Secondary | ICD-10-CM | POA: Diagnosis not present

## 2021-05-09 DIAGNOSIS — M25571 Pain in right ankle and joints of right foot: Secondary | ICD-10-CM | POA: Diagnosis not present

## 2021-05-09 DIAGNOSIS — M25551 Pain in right hip: Secondary | ICD-10-CM | POA: Diagnosis not present

## 2021-05-09 DIAGNOSIS — W01198A Fall on same level from slipping, tripping and stumbling with subsequent striking against other object, initial encounter: Secondary | ICD-10-CM | POA: Diagnosis not present

## 2021-05-09 DIAGNOSIS — Y92009 Unspecified place in unspecified non-institutional (private) residence as the place of occurrence of the external cause: Secondary | ICD-10-CM | POA: Diagnosis not present

## 2021-05-09 DIAGNOSIS — M25521 Pain in right elbow: Secondary | ICD-10-CM | POA: Diagnosis not present

## 2021-05-16 DIAGNOSIS — K5900 Constipation, unspecified: Secondary | ICD-10-CM | POA: Diagnosis not present

## 2021-05-16 DIAGNOSIS — G608 Other hereditary and idiopathic neuropathies: Secondary | ICD-10-CM | POA: Diagnosis not present

## 2021-05-16 DIAGNOSIS — F411 Generalized anxiety disorder: Secondary | ICD-10-CM | POA: Diagnosis not present

## 2021-05-16 DIAGNOSIS — R7309 Other abnormal glucose: Secondary | ICD-10-CM | POA: Diagnosis not present

## 2021-05-16 DIAGNOSIS — K449 Diaphragmatic hernia without obstruction or gangrene: Secondary | ICD-10-CM | POA: Diagnosis not present

## 2021-05-16 DIAGNOSIS — I10 Essential (primary) hypertension: Secondary | ICD-10-CM | POA: Diagnosis not present

## 2021-05-22 DIAGNOSIS — R7309 Other abnormal glucose: Secondary | ICD-10-CM | POA: Diagnosis not present

## 2021-05-22 DIAGNOSIS — Z0000 Encounter for general adult medical examination without abnormal findings: Secondary | ICD-10-CM | POA: Diagnosis not present

## 2021-05-22 DIAGNOSIS — N1832 Chronic kidney disease, stage 3b: Secondary | ICD-10-CM | POA: Diagnosis not present

## 2021-05-22 DIAGNOSIS — G608 Other hereditary and idiopathic neuropathies: Secondary | ICD-10-CM | POA: Diagnosis not present

## 2021-05-22 DIAGNOSIS — K5900 Constipation, unspecified: Secondary | ICD-10-CM | POA: Diagnosis not present

## 2021-05-22 DIAGNOSIS — M2550 Pain in unspecified joint: Secondary | ICD-10-CM | POA: Insufficient documentation

## 2021-05-22 DIAGNOSIS — D649 Anemia, unspecified: Secondary | ICD-10-CM | POA: Diagnosis not present

## 2021-05-22 DIAGNOSIS — I10 Essential (primary) hypertension: Secondary | ICD-10-CM | POA: Diagnosis not present

## 2021-06-21 ENCOUNTER — Emergency Department: Payer: PPO

## 2021-06-21 ENCOUNTER — Encounter: Payer: Self-pay | Admitting: Emergency Medicine

## 2021-06-21 ENCOUNTER — Other Ambulatory Visit: Payer: Self-pay

## 2021-06-21 ENCOUNTER — Emergency Department
Admission: EM | Admit: 2021-06-21 | Discharge: 2021-06-21 | Disposition: A | Discharge status: Home / Self Care | Payer: PPO | Attending: Emergency Medicine | Admitting: Emergency Medicine

## 2021-06-21 DIAGNOSIS — I10 Essential (primary) hypertension: Secondary | ICD-10-CM | POA: Insufficient documentation

## 2021-06-21 DIAGNOSIS — Z79899 Other long term (current) drug therapy: Secondary | ICD-10-CM | POA: Insufficient documentation

## 2021-06-21 DIAGNOSIS — R5383 Other fatigue: Secondary | ICD-10-CM | POA: Diagnosis not present

## 2021-06-21 DIAGNOSIS — R630 Anorexia: Secondary | ICD-10-CM | POA: Diagnosis not present

## 2021-06-21 DIAGNOSIS — U071 COVID-19: Secondary | ICD-10-CM | POA: Diagnosis not present

## 2021-06-21 DIAGNOSIS — Z87891 Personal history of nicotine dependence: Secondary | ICD-10-CM | POA: Diagnosis not present

## 2021-06-21 DIAGNOSIS — R0602 Shortness of breath: Secondary | ICD-10-CM | POA: Diagnosis not present

## 2021-06-21 LAB — CBC
HCT: 38.1 % (ref 36.0–46.0)
Hemoglobin: 13.1 g/dL (ref 12.0–15.0)
MCH: 32.3 pg (ref 26.0–34.0)
MCHC: 34.4 g/dL (ref 30.0–36.0)
MCV: 93.8 fL (ref 80.0–100.0)
Platelets: 246 10*3/uL (ref 150–400)
RBC: 4.06 MIL/uL (ref 3.87–5.11)
RDW: 13.5 % (ref 11.5–15.5)
WBC: 10.7 10*3/uL — ABNORMAL HIGH (ref 4.0–10.5)
nRBC: 0 % (ref 0.0–0.2)

## 2021-06-21 LAB — BASIC METABOLIC PANEL
Anion gap: 12 (ref 5–15)
BUN: 24 mg/dL — ABNORMAL HIGH (ref 8–23)
CO2: 25 mmol/L (ref 22–32)
Calcium: 9.7 mg/dL (ref 8.9–10.3)
Chloride: 101 mmol/L (ref 98–111)
Creatinine, Ser: 1.43 mg/dL — ABNORMAL HIGH (ref 0.44–1.00)
GFR, Estimated: 37 mL/min — ABNORMAL LOW (ref >60)
Glucose, Bld: 129 mg/dL — ABNORMAL HIGH (ref 70–99)
Potassium: 4.3 mmol/L (ref 3.5–5.1)
Sodium: 138 mmol/L (ref 135–145)

## 2021-06-21 MED ORDER — ONDANSETRON 4 MG PO TBDP
4.0000 mg | ORAL_TABLET | Freq: Three times a day (TID) | ORAL | 0 refills | Status: DC | PRN
Start: 2021-06-21 — End: 2023-12-12

## 2021-06-21 NOTE — ED Triage Notes (Signed)
Pt comes into the ED via POV c/o COVID positive, increased SHOB and increased weakness.  PT currently in NAD at this time.  Pt states she was diagnosed with COVID last Wednesday and has been taking medications that she was prescribed.  Pt speaking in full sentences at this time.

## 2021-06-21 NOTE — ED Provider Notes (Signed)
[facility] Emergency Department Provider Note  ____________________________________________  Time seen: Approximately 11:57 AM  I have reviewed the triage vital signs and the nursing notes.   HISTORY  Chief Complaint Shortness of Breath and Covid Positive    HPI Alexis Neal is a 85 y.o. female with a past history of hypertension, bladder prolapse who comes the ED complaining of fatigue for the past week.  She was diagnosed with COVID a week ago, started Paxlovid 6 days ago.  She asserts that she has been taking it 2 times a day as prescribed, but still has 4 doses left.  She is also taking Tessalon Perles which are helping manage her symptoms.  She is eating and drinking although has had some decreased appetite.  Has decreased energy level, but no significant chest pain or shortness of breath, no productive cough.  No syncope or diarrhea or vomiting.  She is worried that she might still be contagious and is not sure how to tell.  She is still wearing a mask.    Past Medical History:  Diagnosis Date   Female bladder prolapse    Hx of basal cell carcinoma    R arm, txted by Dr. Sharlett Iles   Hx of basal cell carcinoma    L upper eyelid txted by Dr. Harless Nakayama   Hypertension    Nerve compression      There are no problems to display for this patient.    Past Surgical History:  Procedure Laterality Date   ABDOMINAL HYSTERECTOMY       Prior to Admission medications   Medication Sig Start Date End Date Taking? Authorizing Provider  ondansetron (ZOFRAN ODT) 4 MG disintegrating tablet Take 1 tablet (4 mg total) by mouth every 8 (eight) hours as needed for nausea or vomiting. 06/21/21  Yes Carrie Mew, MD  benzonatate (TESSALON PERLES) 100 MG capsule Take 1 capsule (100 mg total) by mouth 3 (three) times daily as needed for cough. 03/29/21   Hinda Kehr, MD  fluticasone (FLONASE) 50 MCG/ACT nasal spray Place into both nostrils. 08/14/20   [provider]  gabapentin (NEURONTIN) 100 MG capsule Take by mouth. 05/09/20   [provider]  lisinopril (ZESTRIL) 20 MG tablet Take 20 mg by mouth daily. 10/13/20   [provider]  mometasone (ELOCON) 0.1 % cream Apply 1 application topically as directed. Qd up to 5 days a week aa eczema on back prn flares 11/17/20   Ralene Bathe, MD  traMADol (ULTRAM) 50 MG tablet Take 1 tablet (50 mg total) by mouth every 6 (six) hours as needed. 12/22/17   Paulette Blanch, MD     Allergies Patient has no known allergies.   History reviewed. No pertinent family history.  Social History Social History   Tobacco Use   Smoking status: Former   Smokeless tobacco: Never  Scientific laboratory technician Use: Never used  Substance Use Topics   Alcohol use: No   Drug use: No    Review of Systems  Constitutional:   No fever or chills.  ENT:   No sore throat. No rhinorrhea. Cardiovascular:   No chest pain or syncope. Respiratory:   No dyspnea or cough. Gastrointestinal:   Negative for abdominal pain, vomiting and diarrhea.  Musculoskeletal:   Negative for focal pain or swelling All other systems reviewed and are negative except as documented above in ROS and HPI.  ____________________________________________   PHYSICAL EXAM:  VITAL SIGNS: ED Triage Vitals  Enc Vitals Group     BP 06/21/21 0903 134/69     Pulse Rate 06/21/21 0903 60     Resp 06/21/21 0903 20     Temp 06/21/21 0903 98 F (36.7 C)     Temp Source 06/21/21 0903 Oral     SpO2 06/21/21 0903 100 %     Weight 06/21/21 0904 164 lb 0.4 oz (74.4 kg)     Height 06/21/21 0904 '5\' 6"'$  (1.676 m)     Head Circumference --      Peak Flow --      Pain Score 06/21/21 0904 9     Pain Loc --      Pain Edu? --      Excl. in [place]? --     Vital signs reviewed, nursing assessments reviewed.   Constitutional:   Alert and oriented. Non-toxic appearance. Eyes:   Conjunctivae are normal. EOMI. PERRL. ENT      Head:   Normocephalic  and atraumatic.      Nose:   Wearing a mask.      Mouth/Throat:   Wearing a mask.      Neck:   No meningismus. Full ROM. Hematological/Lymphatic/Immunilogical:   No cervical lymphadenopathy. Cardiovascular:   RRR. Symmetric bilateral radial and DP pulses.  No murmurs. Cap refill less than 2 seconds. Respiratory:   Normal respiratory effort without tachypnea/retractions. Breath sounds are clear and equal bilaterally. No wheezes/rales/rhonchi. Musculoskeletal:   Normal range of motion in all extremities.No lower extremity tenderness.  No edema. Neurologic:   Normal speech and language.  Motor grossly intact. No acute focal neurologic deficits are appreciated.  Skin:    Skin is warm, dry and intact. No rash noted.  No petechiae, purpura, or bullae.  ____________________________________________    LABS (pertinent positives/negatives) (all labs ordered are listed, but only abnormal results are displayed) Labs Reviewed  BASIC METABOLIC PANEL - Abnormal; Notable for the following components:      Result Value   Glucose, Bld 129 (*)    BUN 24 (*)    Creatinine, Ser 1.43 (*)    GFR, Estimated 37 (*)    All other components within normal limits  CBC - Abnormal; Notable for the following components:   WBC 10.7 (*)    All other components within normal limits   ____________________________________________   EKG    ____________________________________________    RADIOLOGY  DG Chest 2 View  Result Date: 06/21/2021 CLINICAL DATA:  COVID positive, increased shortness of breath. EXAM: CHEST - 2 VIEW COMPARISON:  Chest radiograph 03/29/2021 and earlier FINDINGS: The heart size and mediastinal contours are within normal limits. Both lungs are clear. The visualized skeletal structures are unremarkable. IMPRESSION: No active cardiopulmonary disease. Electronically Signed   By: Ileana Roup MD   On: 06/21/2021 09:48     ____________________________________________   PROCEDURES Procedures  ____________________________________________    CLINICAL IMPRESSION / ASSESSMENT AND PLAN / ED COURSE  Medications ordered in the ED: Medications - No data to display  Pertinent labs & imaging results that were available during my care of the patient were reviewed by me and considered in my medical decision making (see chart for details).  LILIANNE DRONEN was evaluated in Emergency Department on 06/21/2021 for the symptoms described in the history of present illness. She was evaluated in the context of the global COVID-19 pandemic, which necessitated consideration that the patient might be at risk for infection with the SARS-CoV-2 virus that causes COVID-19. Institutional protocols  and algorithms that pertain to the evaluation of patients at risk for COVID-19 are in a state of rapid change based on information released by regulatory bodies including the CDC and federal and state organizations. These policies and algorithms were followed during the patient's care in the ED.   Patient comes the ED complaining of persistent fatigue in the setting of known COVID-positive a week ago.  I performed a point-of-care COVID test at the bedside which appears negative.  She is also 7days from first testing positive, so in accordance with current guidelines, advised that she could discontinue quarantine but still needs to wear a mask.  She has not completed her Paxlovid course and I advised her to continue taking the medication as prescribed until it is finished.  Also give her Zofran which she can use for nausea as needed to ensure adequate oral intake.  She stable for discharge, nontoxic without any worrisome symptoms to suggest VTE, vasculitis, carditis, or pneumonia.      ____________________________________________   FINAL CLINICAL IMPRESSION(S) / ED DIAGNOSES    Final diagnoses:  Fatigue, unspecified type  COVID-19  virus infection     ED Discharge Orders          Ordered    ondansetron (ZOFRAN ODT) 4 MG disintegrating tablet  Every 8 hours PRN        06/21/21 1157            Portions of this note were generated with dragon dictation software. Dictation errors may occur despite best attempts at proofreading.    Carrie Mew, MD 06/21/21 [id number]

## 2021-06-21 NOTE — ED Notes (Signed)
See triage note  Presents with weakness   States she tested positive for COVID last week  Was placed on meds but thinks she is not any better   Afebrile on arrival

## 2021-06-27 DIAGNOSIS — I10 Essential (primary) hypertension: Secondary | ICD-10-CM | POA: Diagnosis not present

## 2021-06-27 DIAGNOSIS — R739 Hyperglycemia, unspecified: Secondary | ICD-10-CM | POA: Diagnosis not present

## 2021-06-27 DIAGNOSIS — Z8616 Personal history of COVID-19: Secondary | ICD-10-CM | POA: Diagnosis not present

## 2021-06-27 DIAGNOSIS — R5383 Other fatigue: Secondary | ICD-10-CM | POA: Diagnosis not present

## 2021-06-27 DIAGNOSIS — E860 Dehydration: Secondary | ICD-10-CM | POA: Diagnosis not present

## 2021-06-30 DIAGNOSIS — H02054 Trichiasis without entropian left upper eyelid: Secondary | ICD-10-CM | POA: Diagnosis not present

## 2021-07-24 DIAGNOSIS — N289 Disorder of kidney and ureter, unspecified: Secondary | ICD-10-CM | POA: Diagnosis not present

## 2021-07-24 DIAGNOSIS — I10 Essential (primary) hypertension: Secondary | ICD-10-CM | POA: Diagnosis not present

## 2021-07-24 DIAGNOSIS — Z8616 Personal history of COVID-19: Secondary | ICD-10-CM | POA: Diagnosis not present

## 2021-07-24 DIAGNOSIS — G608 Other hereditary and idiopathic neuropathies: Secondary | ICD-10-CM | POA: Diagnosis not present

## 2021-07-24 DIAGNOSIS — B372 Candidiasis of skin and nail: Secondary | ICD-10-CM | POA: Diagnosis not present

## 2021-08-09 IMAGING — CR DG CHEST 2V
2 series · 2 of 2 positions shown · non-contrast
Comparison: 12/22/2017

CLINICAL DATA: Cough

EXAM:
CHEST - 2 VIEW

[chest pa]
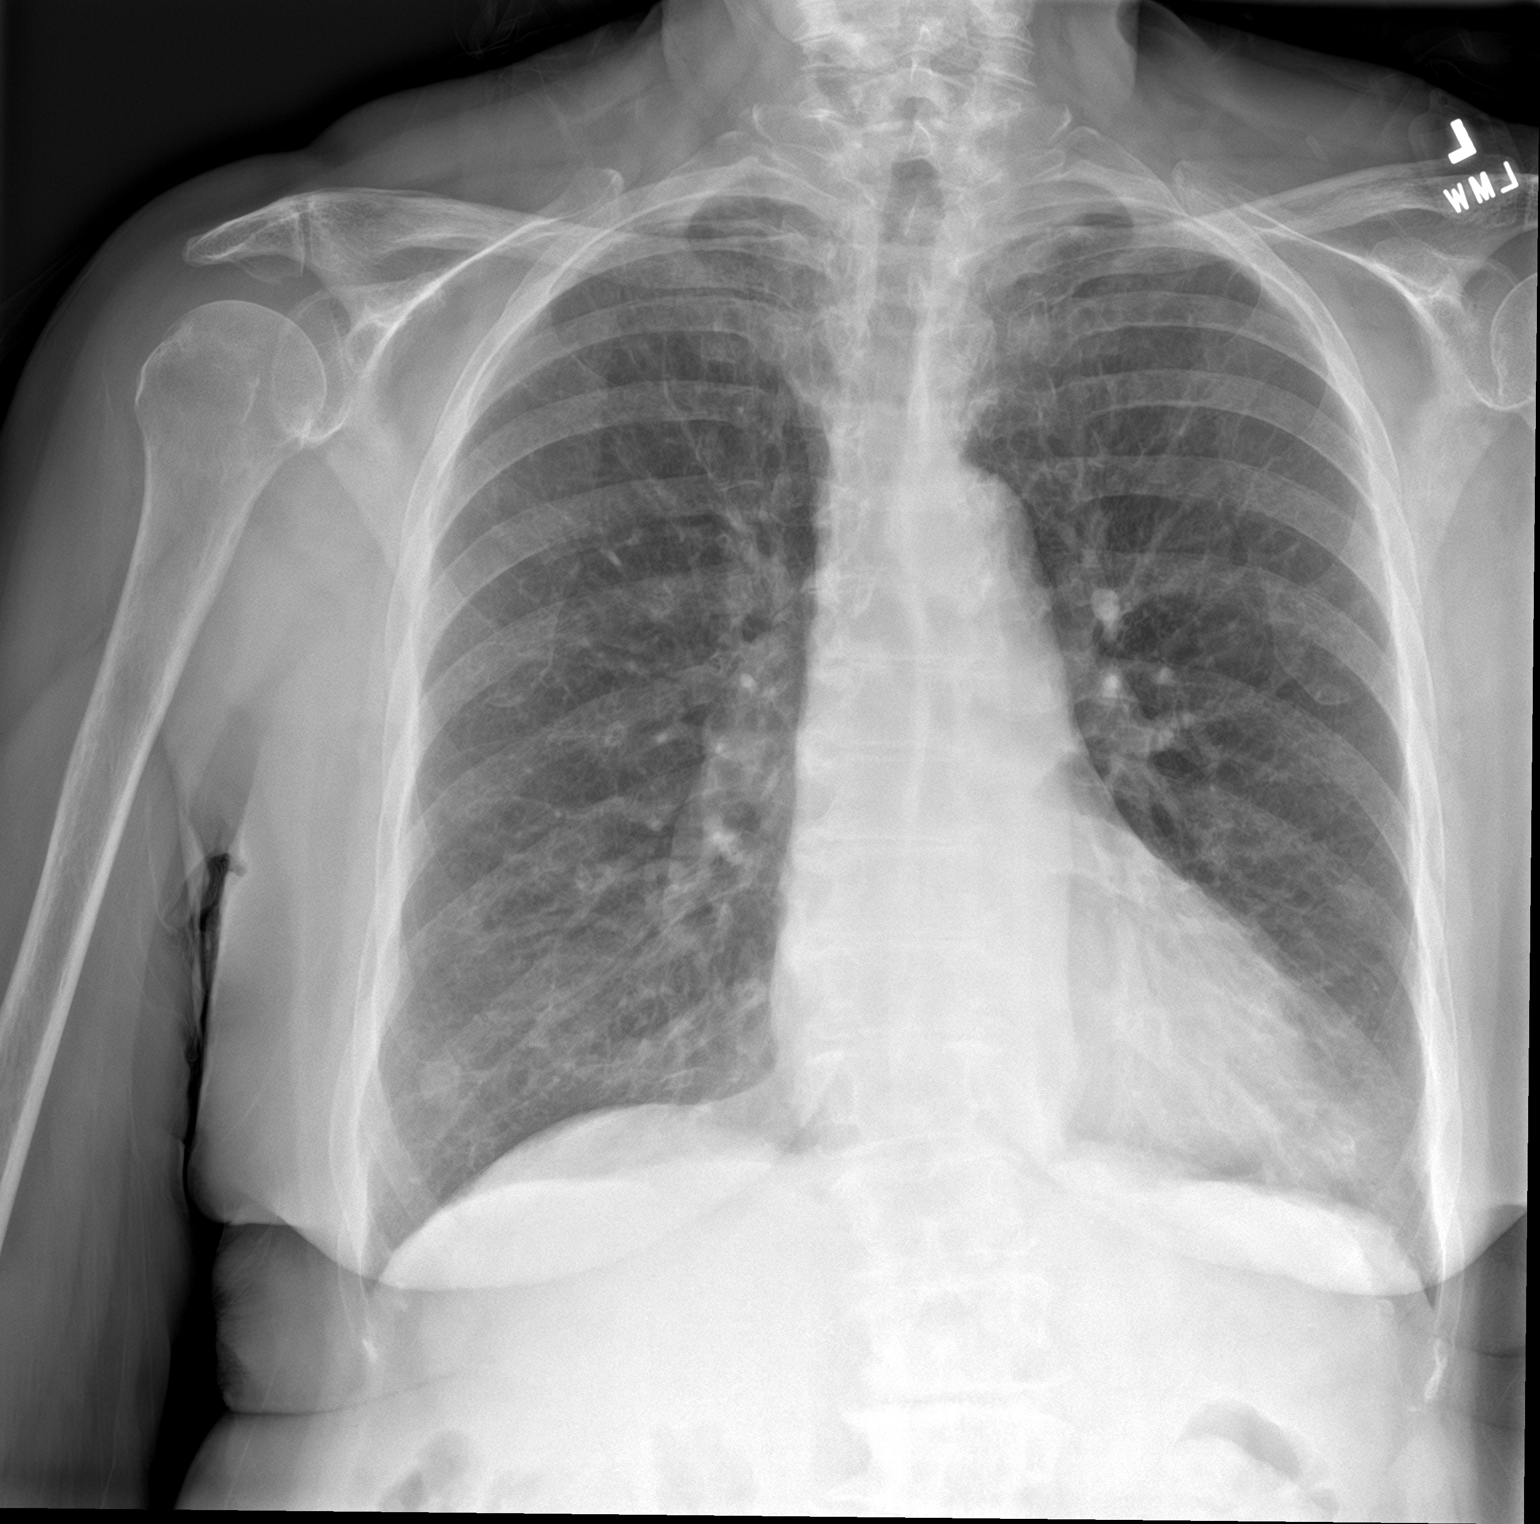

[chest lat]
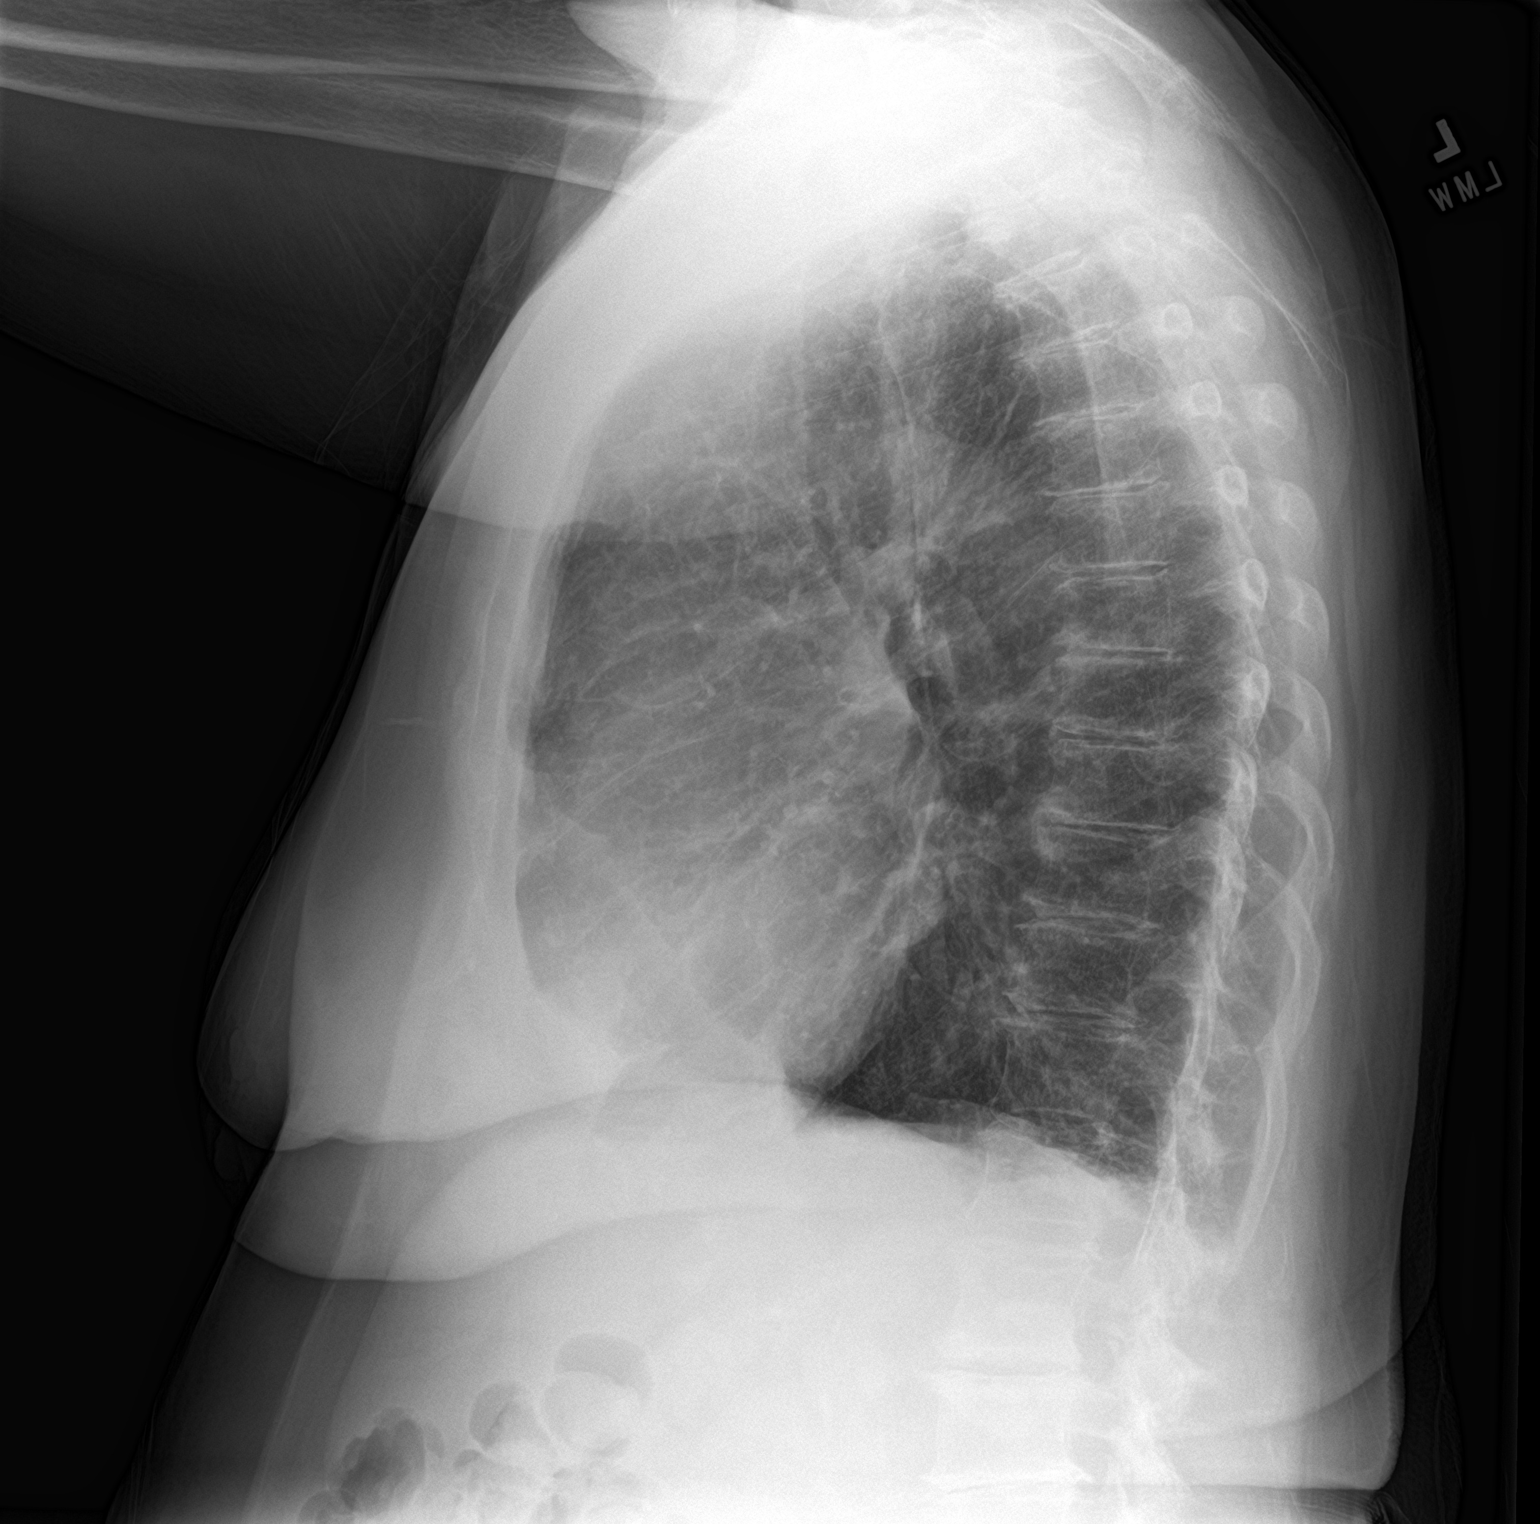

[2 of 2 positions shown; findings below may reference images not displayed]

FINDINGS: The heart size and mediastinal contours are within normal limits.
Both lungs are clear. The visualized skeletal structures are
unremarkable.
IMPRESSION: No active cardiopulmonary disease.

## 2021-08-31 DIAGNOSIS — H353112 Nonexudative age-related macular degeneration, right eye, intermediate dry stage: Secondary | ICD-10-CM | POA: Diagnosis not present

## 2021-10-02 DIAGNOSIS — K5900 Constipation, unspecified: Secondary | ICD-10-CM | POA: Diagnosis not present

## 2021-10-02 DIAGNOSIS — D649 Anemia, unspecified: Secondary | ICD-10-CM | POA: Diagnosis not present

## 2021-10-02 DIAGNOSIS — R7309 Other abnormal glucose: Secondary | ICD-10-CM | POA: Diagnosis not present

## 2021-10-02 DIAGNOSIS — N1832 Chronic kidney disease, stage 3b: Secondary | ICD-10-CM | POA: Diagnosis not present

## 2021-10-02 DIAGNOSIS — Z0000 Encounter for general adult medical examination without abnormal findings: Secondary | ICD-10-CM | POA: Diagnosis not present

## 2021-10-02 DIAGNOSIS — I10 Essential (primary) hypertension: Secondary | ICD-10-CM | POA: Diagnosis not present

## 2021-10-02 DIAGNOSIS — G608 Other hereditary and idiopathic neuropathies: Secondary | ICD-10-CM | POA: Diagnosis not present

## 2021-10-02 DIAGNOSIS — M2550 Pain in unspecified joint: Secondary | ICD-10-CM | POA: Diagnosis not present

## 2021-10-03 DIAGNOSIS — N1832 Chronic kidney disease, stage 3b: Secondary | ICD-10-CM | POA: Diagnosis not present

## 2021-10-03 DIAGNOSIS — R7309 Other abnormal glucose: Secondary | ICD-10-CM | POA: Diagnosis not present

## 2021-10-03 DIAGNOSIS — M25511 Pain in right shoulder: Secondary | ICD-10-CM | POA: Diagnosis not present

## 2021-10-03 DIAGNOSIS — I10 Essential (primary) hypertension: Secondary | ICD-10-CM | POA: Diagnosis not present

## 2021-10-03 DIAGNOSIS — B372 Candidiasis of skin and nail: Secondary | ICD-10-CM | POA: Diagnosis not present

## 2021-10-30 DIAGNOSIS — C44300 Unspecified malignant neoplasm of skin of unspecified part of face: Secondary | ICD-10-CM | POA: Diagnosis not present

## 2021-10-30 DIAGNOSIS — H0263 Xanthelasma of right eye, unspecified eyelid: Secondary | ICD-10-CM | POA: Diagnosis not present

## 2021-10-30 DIAGNOSIS — B372 Candidiasis of skin and nail: Secondary | ICD-10-CM | POA: Diagnosis not present

## 2021-10-30 DIAGNOSIS — I10 Essential (primary) hypertension: Secondary | ICD-10-CM | POA: Diagnosis not present

## 2021-10-30 DIAGNOSIS — K5900 Constipation, unspecified: Secondary | ICD-10-CM | POA: Diagnosis not present

## 2021-10-30 DIAGNOSIS — M2550 Pain in unspecified joint: Secondary | ICD-10-CM | POA: Diagnosis not present

## 2021-10-30 DIAGNOSIS — R7309 Other abnormal glucose: Secondary | ICD-10-CM | POA: Diagnosis not present

## 2021-11-20 DIAGNOSIS — J069 Acute upper respiratory infection, unspecified: Secondary | ICD-10-CM | POA: Diagnosis not present

## 2021-11-20 DIAGNOSIS — Z03818 Encounter for observation for suspected exposure to other biological agents ruled out: Secondary | ICD-10-CM | POA: Diagnosis not present

## 2021-12-11 DIAGNOSIS — I10 Essential (primary) hypertension: Secondary | ICD-10-CM | POA: Diagnosis not present

## 2021-12-11 DIAGNOSIS — M25571 Pain in right ankle and joints of right foot: Secondary | ICD-10-CM | POA: Diagnosis not present

## 2021-12-11 DIAGNOSIS — N1832 Chronic kidney disease, stage 3b: Secondary | ICD-10-CM | POA: Diagnosis not present

## 2021-12-11 DIAGNOSIS — F330 Major depressive disorder, recurrent, mild: Secondary | ICD-10-CM | POA: Diagnosis not present

## 2022-01-08 DIAGNOSIS — Z1389 Encounter for screening for other disorder: Secondary | ICD-10-CM | POA: Diagnosis not present

## 2022-01-16 DIAGNOSIS — M65871 Other synovitis and tenosynovitis, right ankle and foot: Secondary | ICD-10-CM | POA: Diagnosis not present

## 2022-01-16 DIAGNOSIS — R7309 Other abnormal glucose: Secondary | ICD-10-CM | POA: Diagnosis not present

## 2022-01-16 DIAGNOSIS — M76821 Posterior tibial tendinitis, right leg: Secondary | ICD-10-CM | POA: Diagnosis not present

## 2022-01-16 DIAGNOSIS — M79674 Pain in right toe(s): Secondary | ICD-10-CM | POA: Diagnosis not present

## 2022-01-16 DIAGNOSIS — M79675 Pain in left toe(s): Secondary | ICD-10-CM | POA: Diagnosis not present

## 2022-01-16 DIAGNOSIS — B351 Tinea unguium: Secondary | ICD-10-CM | POA: Diagnosis not present

## 2022-01-16 DIAGNOSIS — M79671 Pain in right foot: Secondary | ICD-10-CM | POA: Diagnosis not present

## 2022-02-26 ENCOUNTER — Encounter: Payer: PPO | Admitting: Dermatology

## 2022-02-26 DIAGNOSIS — I10 Essential (primary) hypertension: Secondary | ICD-10-CM | POA: Diagnosis not present

## 2022-02-26 DIAGNOSIS — C44300 Unspecified malignant neoplasm of skin of unspecified part of face: Secondary | ICD-10-CM | POA: Diagnosis not present

## 2022-02-26 DIAGNOSIS — M109 Gout, unspecified: Secondary | ICD-10-CM | POA: Diagnosis not present

## 2022-02-26 DIAGNOSIS — K5900 Constipation, unspecified: Secondary | ICD-10-CM | POA: Diagnosis not present

## 2022-02-26 DIAGNOSIS — M2550 Pain in unspecified joint: Secondary | ICD-10-CM | POA: Diagnosis not present

## 2022-02-26 DIAGNOSIS — B372 Candidiasis of skin and nail: Secondary | ICD-10-CM | POA: Diagnosis not present

## 2022-02-26 DIAGNOSIS — H0263 Xanthelasma of right eye, unspecified eyelid: Secondary | ICD-10-CM | POA: Diagnosis not present

## 2022-02-26 DIAGNOSIS — R7309 Other abnormal glucose: Secondary | ICD-10-CM | POA: Diagnosis not present

## 2022-03-01 DIAGNOSIS — H353112 Nonexudative age-related macular degeneration, right eye, intermediate dry stage: Secondary | ICD-10-CM | POA: Diagnosis not present

## 2022-03-01 DIAGNOSIS — H353222 Exudative age-related macular degeneration, left eye, with inactive choroidal neovascularization: Secondary | ICD-10-CM | POA: Diagnosis not present

## 2022-03-05 DIAGNOSIS — N3945 Continuous leakage: Secondary | ICD-10-CM | POA: Diagnosis not present

## 2022-03-05 DIAGNOSIS — G8929 Other chronic pain: Secondary | ICD-10-CM | POA: Diagnosis not present

## 2022-03-05 DIAGNOSIS — G608 Other hereditary and idiopathic neuropathies: Secondary | ICD-10-CM | POA: Diagnosis not present

## 2022-03-05 DIAGNOSIS — R7309 Other abnormal glucose: Secondary | ICD-10-CM | POA: Diagnosis not present

## 2022-03-05 DIAGNOSIS — I10 Essential (primary) hypertension: Secondary | ICD-10-CM | POA: Diagnosis not present

## 2022-03-05 DIAGNOSIS — M25511 Pain in right shoulder: Secondary | ICD-10-CM | POA: Diagnosis not present

## 2022-06-01 DIAGNOSIS — H353112 Nonexudative age-related macular degeneration, right eye, intermediate dry stage: Secondary | ICD-10-CM | POA: Diagnosis not present

## 2022-06-01 DIAGNOSIS — H353222 Exudative age-related macular degeneration, left eye, with inactive choroidal neovascularization: Secondary | ICD-10-CM | POA: Diagnosis not present

## 2022-06-01 DIAGNOSIS — Z961 Presence of intraocular lens: Secondary | ICD-10-CM | POA: Diagnosis not present

## 2022-06-01 DIAGNOSIS — H26492 Other secondary cataract, left eye: Secondary | ICD-10-CM | POA: Diagnosis not present

## 2022-06-19 DIAGNOSIS — H353112 Nonexudative age-related macular degeneration, right eye, intermediate dry stage: Secondary | ICD-10-CM | POA: Diagnosis not present

## 2022-07-02 DIAGNOSIS — G608 Other hereditary and idiopathic neuropathies: Secondary | ICD-10-CM | POA: Diagnosis not present

## 2022-07-02 DIAGNOSIS — N3945 Continuous leakage: Secondary | ICD-10-CM | POA: Diagnosis not present

## 2022-07-02 DIAGNOSIS — G8929 Other chronic pain: Secondary | ICD-10-CM | POA: Diagnosis not present

## 2022-07-02 DIAGNOSIS — R7309 Other abnormal glucose: Secondary | ICD-10-CM | POA: Diagnosis not present

## 2022-07-02 DIAGNOSIS — M25511 Pain in right shoulder: Secondary | ICD-10-CM | POA: Diagnosis not present

## 2022-07-02 DIAGNOSIS — I10 Essential (primary) hypertension: Secondary | ICD-10-CM | POA: Diagnosis not present

## 2022-07-09 DIAGNOSIS — N1832 Chronic kidney disease, stage 3b: Secondary | ICD-10-CM | POA: Diagnosis not present

## 2022-07-09 DIAGNOSIS — Z780 Asymptomatic menopausal state: Secondary | ICD-10-CM | POA: Diagnosis not present

## 2022-07-09 DIAGNOSIS — K5909 Other constipation: Secondary | ICD-10-CM | POA: Diagnosis not present

## 2022-07-09 DIAGNOSIS — L298 Other pruritus: Secondary | ICD-10-CM | POA: Diagnosis not present

## 2022-07-09 DIAGNOSIS — K449 Diaphragmatic hernia without obstruction or gangrene: Secondary | ICD-10-CM | POA: Diagnosis not present

## 2022-07-09 DIAGNOSIS — G608 Other hereditary and idiopathic neuropathies: Secondary | ICD-10-CM | POA: Diagnosis not present

## 2022-07-09 DIAGNOSIS — Z0000 Encounter for general adult medical examination without abnormal findings: Secondary | ICD-10-CM | POA: Diagnosis not present

## 2022-07-09 DIAGNOSIS — R7309 Other abnormal glucose: Secondary | ICD-10-CM | POA: Diagnosis not present

## 2022-07-09 DIAGNOSIS — H0263 Xanthelasma of right eye, unspecified eyelid: Secondary | ICD-10-CM | POA: Diagnosis not present

## 2022-07-09 DIAGNOSIS — I10 Essential (primary) hypertension: Secondary | ICD-10-CM | POA: Diagnosis not present

## 2022-09-17 DIAGNOSIS — K5909 Other constipation: Secondary | ICD-10-CM | POA: Diagnosis not present

## 2022-09-17 DIAGNOSIS — M159 Polyosteoarthritis, unspecified: Secondary | ICD-10-CM | POA: Diagnosis not present

## 2022-09-17 DIAGNOSIS — I10 Essential (primary) hypertension: Secondary | ICD-10-CM | POA: Diagnosis not present

## 2022-09-17 DIAGNOSIS — J029 Acute pharyngitis, unspecified: Secondary | ICD-10-CM | POA: Diagnosis not present

## 2022-11-02 DIAGNOSIS — Z0000 Encounter for general adult medical examination without abnormal findings: Secondary | ICD-10-CM | POA: Diagnosis not present

## 2022-11-02 DIAGNOSIS — K449 Diaphragmatic hernia without obstruction or gangrene: Secondary | ICD-10-CM | POA: Diagnosis not present

## 2022-11-02 DIAGNOSIS — L298 Other pruritus: Secondary | ICD-10-CM | POA: Diagnosis not present

## 2022-11-02 DIAGNOSIS — K5909 Other constipation: Secondary | ICD-10-CM | POA: Diagnosis not present

## 2022-11-02 DIAGNOSIS — Z780 Asymptomatic menopausal state: Secondary | ICD-10-CM | POA: Diagnosis not present

## 2022-11-02 DIAGNOSIS — H0263 Xanthelasma of right eye, unspecified eyelid: Secondary | ICD-10-CM | POA: Diagnosis not present

## 2022-11-02 DIAGNOSIS — I10 Essential (primary) hypertension: Secondary | ICD-10-CM | POA: Diagnosis not present

## 2022-11-02 DIAGNOSIS — G608 Other hereditary and idiopathic neuropathies: Secondary | ICD-10-CM | POA: Diagnosis not present

## 2022-11-02 DIAGNOSIS — N1832 Chronic kidney disease, stage 3b: Secondary | ICD-10-CM | POA: Diagnosis not present

## 2022-11-02 DIAGNOSIS — R7309 Other abnormal glucose: Secondary | ICD-10-CM | POA: Diagnosis not present

## 2022-11-08 DIAGNOSIS — R7309 Other abnormal glucose: Secondary | ICD-10-CM | POA: Diagnosis not present

## 2022-11-08 DIAGNOSIS — H0263 Xanthelasma of right eye, unspecified eyelid: Secondary | ICD-10-CM | POA: Diagnosis not present

## 2022-11-08 DIAGNOSIS — K5909 Other constipation: Secondary | ICD-10-CM | POA: Diagnosis not present

## 2022-11-08 DIAGNOSIS — M2550 Pain in unspecified joint: Secondary | ICD-10-CM | POA: Diagnosis not present

## 2022-11-08 DIAGNOSIS — I10 Essential (primary) hypertension: Secondary | ICD-10-CM | POA: Diagnosis not present

## 2023-03-06 DIAGNOSIS — H0263 Xanthelasma of right eye, unspecified eyelid: Secondary | ICD-10-CM | POA: Diagnosis not present

## 2023-03-06 DIAGNOSIS — K5909 Other constipation: Secondary | ICD-10-CM | POA: Diagnosis not present

## 2023-03-06 DIAGNOSIS — R7309 Other abnormal glucose: Secondary | ICD-10-CM | POA: Diagnosis not present

## 2023-03-06 DIAGNOSIS — M2550 Pain in unspecified joint: Secondary | ICD-10-CM | POA: Diagnosis not present

## 2023-03-06 DIAGNOSIS — I10 Essential (primary) hypertension: Secondary | ICD-10-CM | POA: Diagnosis not present

## 2023-03-13 DIAGNOSIS — R7309 Other abnormal glucose: Secondary | ICD-10-CM | POA: Diagnosis not present

## 2023-03-13 DIAGNOSIS — Z0000 Encounter for general adult medical examination without abnormal findings: Secondary | ICD-10-CM | POA: Diagnosis not present

## 2023-03-13 DIAGNOSIS — M159 Polyosteoarthritis, unspecified: Secondary | ICD-10-CM | POA: Diagnosis not present

## 2023-03-13 DIAGNOSIS — N1832 Chronic kidney disease, stage 3b: Secondary | ICD-10-CM | POA: Diagnosis not present

## 2023-03-13 DIAGNOSIS — Z6827 Body mass index (BMI) 27.0-27.9, adult: Secondary | ICD-10-CM | POA: Diagnosis not present

## 2023-03-13 DIAGNOSIS — I10 Essential (primary) hypertension: Secondary | ICD-10-CM | POA: Diagnosis not present

## 2023-03-13 DIAGNOSIS — N39498 Other specified urinary incontinence: Secondary | ICD-10-CM | POA: Diagnosis not present

## 2023-03-13 DIAGNOSIS — F330 Major depressive disorder, recurrent, mild: Secondary | ICD-10-CM | POA: Diagnosis not present

## 2023-05-31 ENCOUNTER — Emergency Department: Payer: PPO

## 2023-05-31 ENCOUNTER — Emergency Department
Admission: EM | Admit: 2023-05-31 | Discharge: 2023-05-31 | Disposition: A | Discharge status: Home / Self Care | Payer: PPO | Attending: Emergency Medicine | Admitting: Emergency Medicine

## 2023-05-31 ENCOUNTER — Other Ambulatory Visit: Payer: Self-pay

## 2023-05-31 DIAGNOSIS — R202 Paresthesia of skin: Secondary | ICD-10-CM | POA: Diagnosis not present

## 2023-05-31 DIAGNOSIS — R200 Anesthesia of skin: Secondary | ICD-10-CM | POA: Insufficient documentation

## 2023-05-31 DIAGNOSIS — I10 Essential (primary) hypertension: Secondary | ICD-10-CM | POA: Insufficient documentation

## 2023-05-31 LAB — CBC WITH DIFFERENTIAL/PLATELET
Abs Immature Granulocytes: 0.04 10*3/uL (ref 0.00–0.07)
Basophils Absolute: 0.1 10*3/uL (ref 0.0–0.1)
Basophils Relative: 1 %
Eosinophils Absolute: 0.2 10*3/uL (ref 0.0–0.5)
Eosinophils Relative: 2 %
HCT: 39.2 % (ref 36.0–46.0)
Hemoglobin: 13.1 g/dL (ref 12.0–15.0)
Immature Granulocytes: 0 %
Lymphocytes Relative: 15 %
Lymphs Abs: 1.5 10*3/uL (ref 0.7–4.0)
MCH: 31.2 pg (ref 26.0–34.0)
MCHC: 33.4 g/dL (ref 30.0–36.0)
MCV: 93.3 fL (ref 80.0–100.0)
Monocytes Absolute: 0.7 10*3/uL (ref 0.1–1.0)
Monocytes Relative: 7 %
Neutro Abs: 7.3 10*3/uL (ref 1.7–7.7)
Neutrophils Relative %: 75 %
Platelets: 193 10*3/uL (ref 150–400)
RBC: 4.2 MIL/uL (ref 3.87–5.11)
RDW: 13.7 % (ref 11.5–15.5)
WBC: 9.7 10*3/uL (ref 4.0–10.5)
nRBC: 0 % (ref 0.0–0.2)

## 2023-05-31 LAB — COMPREHENSIVE METABOLIC PANEL
ALT: 24 U/L (ref 0–44)
AST: 25 U/L (ref 15–41)
Albumin: 3.9 g/dL (ref 3.5–5.0)
Alkaline Phosphatase: 57 U/L (ref 38–126)
Anion gap: 10 (ref 5–15)
BUN: 21 mg/dL (ref 8–23)
CO2: 24 mmol/L (ref 22–32)
Calcium: 9.5 mg/dL (ref 8.9–10.3)
Chloride: 102 mmol/L (ref 98–111)
Creatinine, Ser: 1.15 mg/dL — ABNORMAL HIGH (ref 0.44–1.00)
GFR, Estimated: 47 mL/min — ABNORMAL LOW (ref >60)
Glucose, Bld: 123 mg/dL — ABNORMAL HIGH (ref 70–99)
Potassium: 4.1 mmol/L (ref 3.5–5.1)
Sodium: 136 mmol/L (ref 135–145)
Total Bilirubin: 0.7 mg/dL (ref 0.3–1.2)
Total Protein: 6.7 g/dL (ref 6.5–8.1)

## 2023-05-31 NOTE — ED Notes (Signed)
Patient didn't hear her name called. Flex was called and informed that the patient is out in the lobby.

## 2023-05-31 NOTE — ED Triage Notes (Signed)
Patient was sent by [facility] for c/o lower lip numbness for 1 week. Sensation is intact, but dull. MD wants to r/o stroke. Per report, no other complaints or deficits noted by [facility] staff. Patient denies tingling to lower lip. Patient c/o blurry vision, has history of glaucoma.

## 2023-05-31 NOTE — ED Provider Notes (Signed)
   [facility] Provider Note    Event Date/Time   First MD Initiated Contact with Patient 05/31/23 1310     (approximate)  History   Chief Complaint: Dental Problem and Numbness  HPI  Alexis Neal is a 87 y.o. female  WIth a pmhx of htn, presents to the ED with intermittent numbness to left lips.  Pt states for the past 1 week she has had intermittent numbness/tingling to left side of her lips.  No arm/leg symptoms. No balance issues.  No trouble speaking/thinking.    Physical Exam   Triage Vital Signs: ED Triage Vitals  Encounter Vitals Group     BP 05/31/23 1119 (!) 158/93     Systolic BP Percentile --      Diastolic BP Percentile --      Pulse Rate 05/31/23 1119 61     Resp 05/31/23 1119 18     Temp 05/31/23 1119 97.8 F (36.6 C)     Temp Source 05/31/23 1119 Oral     SpO2 05/31/23 1119 95 %     Weight 05/31/23 1120 168 lb (76.2 kg)     Height 05/31/23 1120 5\' 6"  (1.676 m)     Head Circumference --      Peak Flow --      Pain Score 05/31/23 1120 4     Pain Loc --      Pain Education --      Exclude from Growth Chart --     Most recent vital signs: Vitals:   05/31/23 1119  BP: (!) 158/93  Pulse: 61  Resp: 18  Temp: 97.8 F (36.6 C)  SpO2: 95%    General: Awake, no distress.  CV:  Good peripheral perfusion.  Regular rate and rhythm  Resp:  Normal effort.  Equal breath sounds bilaterally.  Abd:  No distention.  Soft, nontender.  No rebound or guarding. Other:  Equal grips b/l.  Good strength in all extremities. No cranial nerve deficits on exam. No obvious dental infection/abscess, poor dentition.   ED Results / Procedures / Treatments   RADIOLOGY  I have reviewed interpretted the T2 images of MRI brain.  No obvious cva or mass on my evaluation. Radiology has read the MRI as negative.   MEDICATIONS ORDERED IN ED: Medications - No data to display   IMPRESSION / MDM / ASSESSMENT AND PLAN / ED COURSE  I reviewed the triage  vital signs and the nursing notes.  Patient's presentation is most consistent with acute presentation with potential threat to life or bodily function.  Pt p/w numbness intermittently to left lips. Reassuring physical exam.  CBC is normal.  Chemistry is normal.  MRI is normal.   GIven the pts reassuring w/u I believe pt is safe for dc home with pcp f/u.  Pt agreeable to plan.  FINAL CLINICAL IMPRESSION(S) / ED DIAGNOSES   paresthesias   Note:  This document was prepared using Dragon voice recognition software and may include unintentional dictation errors.   Minna Antis, MD 05/31/23 1335

## 2023-05-31 NOTE — ED Triage Notes (Addendum)
Pt states she was seen at [facility] earlier today and sent here, pt states she developed left side bottom lip numbness a week ago right beside an abscessed tooth pt states numbness is intermittent. Pt states they told her at [facility] she may have an infection in her mouth. Pt denies any neuro deficits, speech clear no facial droop or weakness or headache.

## 2023-06-05 ENCOUNTER — Telehealth: Payer: Self-pay

## 2023-06-05 NOTE — Telephone Encounter (Signed)
Transition Care Management Follow-up Telephone Call Date of discharge and from where: 05/31/2023 [facility] How have you been since you were released from the hospital? Patient is feeling much better. Any questions or concerns? No  Items Reviewed: Did the pt receive and understand the discharge instructions provided? Yes  Medications obtained and verified?  No medication prescribed. Other? No  Any new allergies since your discharge? No  Dietary orders reviewed? Yes Do you have support at home? Yes   Follow up appointments reviewed:  PCP Hospital f/u appt confirmed? No  Scheduled to see  on  @ . Specialist Hospital f/u appt confirmed? No  Scheduled to see  on  @ . Are transportation arrangements needed? No  If their condition worsens, is the pt aware to call PCP or go to the Emergency Dept.? Yes Was the patient provided with contact information for the PCP's office or ED? Yes Was to pt encouraged to call back with questions or concerns? Yes  Alexis Neal [facility]  [facility] Population Health Community Resource Care Guide   [email or web address]  ?? [phone]   Website: [email or web address]  [email or web address]

## 2023-06-05 NOTE — Telephone Encounter (Signed)
Transition Care Management Unsuccessful Follow-up Telephone Call  Date of discharge and from where:  05/31/2023 [facility]  Attempts:  1st Attempt  Reason for unsuccessful TCM follow-up call:  Left voice message   [facility]  [facility] Population Health Community Resource Care Guide   [email or web address]  ?? [phone]   Website: [email or web address]  [email or web address]

## 2023-06-20 ENCOUNTER — Ambulatory Visit: Payer: PPO | Admitting: Dermatology

## 2023-07-01 ENCOUNTER — Ambulatory Visit: Payer: PPO | Admitting: Dermatology

## 2023-07-04 ENCOUNTER — Ambulatory Visit: Payer: PPO | Admitting: Dermatology

## 2023-07-04 DIAGNOSIS — L720 Epidermal cyst: Secondary | ICD-10-CM

## 2023-07-04 DIAGNOSIS — L570 Actinic keratosis: Secondary | ICD-10-CM | POA: Diagnosis not present

## 2023-07-04 DIAGNOSIS — L304 Erythema intertrigo: Secondary | ICD-10-CM

## 2023-07-04 DIAGNOSIS — L219 Seborrheic dermatitis, unspecified: Secondary | ICD-10-CM | POA: Diagnosis not present

## 2023-07-04 DIAGNOSIS — Z7189 Other specified counseling: Secondary | ICD-10-CM

## 2023-07-04 DIAGNOSIS — Z1283 Encounter for screening for malignant neoplasm of skin: Secondary | ICD-10-CM

## 2023-07-04 DIAGNOSIS — L578 Other skin changes due to chronic exposure to nonionizing radiation: Secondary | ICD-10-CM | POA: Diagnosis not present

## 2023-07-04 DIAGNOSIS — L729 Follicular cyst of the skin and subcutaneous tissue, unspecified: Secondary | ICD-10-CM

## 2023-07-04 DIAGNOSIS — Z79899 Other long term (current) drug therapy: Secondary | ICD-10-CM

## 2023-07-04 DIAGNOSIS — L2089 Other atopic dermatitis: Secondary | ICD-10-CM

## 2023-07-04 DIAGNOSIS — L2081 Atopic neurodermatitis: Secondary | ICD-10-CM

## 2023-07-04 DIAGNOSIS — L814 Other melanin hyperpigmentation: Secondary | ICD-10-CM

## 2023-07-04 MED ORDER — MOMETASONE FUROATE 0.1 % EX CREA
TOPICAL_CREAM | CUTANEOUS | 3 refills | Status: DC
Start: 2023-07-04 — End: 2024-09-08

## 2023-07-04 NOTE — Patient Instructions (Signed)

## 2023-07-04 NOTE — Progress Notes (Signed)
Follow-Up Visit   Subjective  Alexis Neal is a 87 y.o. female who presents for the following: Rash on the neck, chest, inframammary, and underarms. Currently she is using OTC antifungal powder, but rash continues to be itchy.   The patient has spots, moles and lesions to be evaluated, some may be new or changing and the patient may have concern these could be cancer.   The following portions of the chart were reviewed this encounter and updated as appropriate: medications, allergies, medical history  Review of Systems:  No other skin or systemic complaints except as noted in HPI or Assessment and Plan.  Objective  Well appearing patient in no apparent distress; mood and affect are within normal limits.   A focused examination was performed of the following areas: the face, arms, hands, chest, back, inframammary   Relevant exam findings are noted in the Assessment and Plan.  R nose x 1, forehead x 1 (2) Erythematous thin papules/macules with gritty scale.     Assessment & Plan   ACTINIC DAMAGE - chronic, secondary to cumulative UV radiation exposure/sun exposure over time - diffuse scaly erythematous macules with underlying dyspigmentation - Recommend daily broad spectrum sunscreen SPF 30+ to sun-exposed areas, reapply every 2 hours as needed.  - Recommend staying in the shade or wearing long sleeves, sun glasses (UVA+UVB protection) and wide brim hats (4-inch brim around the entire circumference of the hat). - Call for new or changing lesions.  SEBORRHEIC KERATOSIS - Stuck-on, waxy, tan-brown papules and/or plaques  - Benign-appearing - Discussed benign etiology and prognosis. - Observe - Call for any changes  LENTIGINES Exam: scattered tan macules Due to sun exposure Treatment Plan: Benign-appearing, observe. Recommend daily broad spectrum sunscreen SPF 30+ to sun-exposed areas, reapply every 2 hours as needed.  Call for any changes  MELANOCYTIC NEVI Exam:  Tan-brown and/or pink-flesh-colored symmetric macules and papules  Treatment Plan: Benign appearing on exam today. Recommend observation. Call clinic for new or changing moles. Recommend daily use of broad spectrum spf 30+ sunscreen to sun-exposed areas.   EPIDERMAL INCLUSION CYST Exam: Subcutaneous nodule at R lower eyelid 0.8 cm  Benign-appearing. Exam most consistent with an epidermal inclusion cyst. Discussed that a cyst is a benign growth that can grow over time and sometimes get irritated or inflamed. Recommend observation if it is not bothersome. Discussed option of surgical excision to remove it if it is growing, symptomatic, or other changes noted. Please call for new or changing lesions so they can be evaluated.  AK (actinic keratosis) (2) R nose x 1, forehead x 1  Destruction of lesion - R nose x 1, forehead x 1 (2) Complexity: simple   Destruction method: cryotherapy   Informed consent: discussed and consent obtained   Timeout:  patient name, date of birth, surgical site, and procedure verified Lesion destroyed using liquid nitrogen: Yes   Region frozen until ice ball extended beyond lesion: Yes   Outcome: patient tolerated procedure well with no complications   Post-procedure details: wound care instructions given    Atopic neurodermatitis  Related Medications mometasone (ELOCON) 0.1 % cream Apply to aa's rash QD-BID PRN.  ATOPIC DERMATITIS/atopic neurodermatitis  Exam: Scaly pink papules coalescing to plaques  Chronic and persistent condition with duration or expected duration over one year. Condition is bothersome/symptomatic for patient. Currently flared.  Atopic dermatitis (eczema) is a chronic, relapsing, pruritic condition that can significantly affect quality of life. It is often associated with allergic rhinitis and/or asthma  and can require treatment with topical medications, phototherapy, or in severe cases biologic injectable medication (Dupixent; Adbry) or  Oral JAK inhibitors.  Treatment Plan: Start Mometasone cream apply to aa's QD-BID PRN. Topical steroids (such as triamcinolone, fluocinolone, fluocinonide, mometasone, clobetasol, halobetasol, betamethasone, hydrocortisone) can cause thinning and lightening of the skin if they are used for too long in the same area. Your physician has selected the right strength medicine for your problem and area affected on the body. Please use your medication only as directed by your physician to prevent side effects.    INTERTRIGO Exam Erythematous macerated patches  Chronic and persistent condition with duration or expected duration over one year. Condition is symptomatic/ bothersome to patient. Not currently at goal.  Intertrigo is a chronic recurrent rash that occurs in skin fold areas that may be associated with friction; heat; moisture; yeast; fungus; and bacteria.  It is exacerbated by increased movement / activity; sweating; and higher atmospheric temperature.  Treatment Plan Start Mometasone cream apply to aa's QD-BID PRN. Topical steroids (such as triamcinolone, fluocinolone, fluocinonide, mometasone, clobetasol, halobetasol, betamethasone, hydrocortisone) can cause thinning and lightening of the skin if they are used for too long in the same area. Your physician has selected the right strength medicine for your problem and area affected on the body. Please use your medication only as directed by your physician to prevent side effects.   Recommend gentle skin care.  Return for surgery - cyst R lower eyelid.  Maylene Roes, CMA, am acting as scribe for Armida Sans, MD .   Documentation: I have reviewed the above documentation for accuracy and completeness, and I agree with the above.  Armida Sans, MD

## 2023-07-12 ENCOUNTER — Encounter: Payer: Self-pay | Admitting: Dermatology

## 2023-07-16 DIAGNOSIS — J029 Acute pharyngitis, unspecified: Secondary | ICD-10-CM | POA: Diagnosis not present

## 2023-07-16 DIAGNOSIS — H9201 Otalgia, right ear: Secondary | ICD-10-CM | POA: Diagnosis not present

## 2023-07-18 DIAGNOSIS — R8290 Unspecified abnormal findings in urine: Secondary | ICD-10-CM | POA: Diagnosis not present

## 2023-07-18 DIAGNOSIS — M159 Polyosteoarthritis, unspecified: Secondary | ICD-10-CM | POA: Diagnosis not present

## 2023-07-18 DIAGNOSIS — R7309 Other abnormal glucose: Secondary | ICD-10-CM | POA: Diagnosis not present

## 2023-07-18 DIAGNOSIS — N39498 Other specified urinary incontinence: Secondary | ICD-10-CM | POA: Diagnosis not present

## 2023-07-18 DIAGNOSIS — I10 Essential (primary) hypertension: Secondary | ICD-10-CM | POA: Diagnosis not present

## 2023-07-21 ENCOUNTER — Encounter: Payer: Self-pay | Admitting: Emergency Medicine

## 2023-07-21 ENCOUNTER — Other Ambulatory Visit: Payer: Self-pay

## 2023-07-21 ENCOUNTER — Emergency Department
Admission: EM | Admit: 2023-07-21 | Discharge: 2023-07-21 | Disposition: A | Discharge status: Home / Self Care | Payer: PPO | Attending: Emergency Medicine | Admitting: Emergency Medicine

## 2023-07-21 DIAGNOSIS — I10 Essential (primary) hypertension: Secondary | ICD-10-CM | POA: Diagnosis not present

## 2023-07-21 DIAGNOSIS — W269XXA Contact with unspecified sharp object(s), initial encounter: Secondary | ICD-10-CM | POA: Insufficient documentation

## 2023-07-21 DIAGNOSIS — S51011A Laceration without foreign body of right elbow, initial encounter: Secondary | ICD-10-CM | POA: Insufficient documentation

## 2023-07-21 DIAGNOSIS — S59901A Unspecified injury of right elbow, initial encounter: Secondary | ICD-10-CM | POA: Diagnosis present

## 2023-07-21 NOTE — Discharge Instructions (Signed)
Keep the wound clean, dry, and covered.  Avoid any lotions, creams, ointments, oils over the wound glue.  Follow-up with your primary provider as needed.  Consider podiatry for ongoing footcare needs.

## 2023-07-21 NOTE — ED Triage Notes (Signed)
Pt presents ambulatory to triage via POV with complaints of R toe lac after cutting it with nail clippers. Pt states she put a bandage on there and when she removed it she made the lac worse. A&Ox4 at this time. Denies taking blood thinners, nor ASA.

## 2023-07-21 NOTE — ED Provider Notes (Signed)
[facility] Emergency Department Provider Note     Event Date/Time   First MD Initiated Contact with Patient 07/21/23 2140     (approximate)   History   Laceration   HPI  Alexis Neal is a 87 y.o. female with a history of HTN, peripheral neuropathy, and macular degeneration, presents to the ED via EMS from home.  Patient apparently attempted to trim her toenails using nail clippers, and apparently cut the distal toe, causes a local bleeding.  She apparently also attempted to pull off of the callus of her great toe thinking it was a bandage, creating a local wound.  She presents to the ED for evaluation of her complaints.  She denies any blood thinner use or aspirin therapy.   Physical Exam   Triage Vital Signs: ED Triage Vitals  Encounter Vitals Group     BP 07/21/23 2100 (!) 185/65     Systolic BP Percentile --      Diastolic BP Percentile --      Pulse Rate 07/21/23 2100 62     Resp 07/21/23 2100 18     Temp 07/21/23 2100 98 F (36.7 C)     Temp Source 07/21/23 2100 Oral     SpO2 07/21/23 2100 100 %     Weight 07/21/23 2059 172 lb (78 kg)     Height 07/21/23 2059 5\' 6"  (1.676 m)     Head Circumference --      Peak Flow --      Pain Score --      Pain Loc --      Pain Education --      Exclude from Growth Chart --     Most recent vital signs: Vitals:   07/21/23 2100  BP: (!) 185/65  Pulse: 62  Resp: 18  Temp: 98 F (36.7 C)  SpO2: 100%    General Awake, no distress. NAD HEENT NCAT. PERRL. EOMI. No rhinorrhea. Mucous membranes are moist.  CV:  Good peripheral perfusion. RRR RESP:  Normal effort. CTA ABD:  No distention.  MSK:  Right great toe with a distal superficial laceration with no active bleeding consistent with toenail clipper injury.  The lateral toe with a callus separated at the distal phalanx, consistent with a local skin tear   ED Results / Procedures / Treatments   Labs (all labs ordered are listed, but only  abnormal results are displayed) Labs Reviewed - No data to display   EKG  RADIOLOGY  No results found.   PROCEDURES:  Critical Care performed: No  ..Laceration Repair  Date/Time: 07/21/2023 10:34 PM  Performed by: Lissa Hoard, PA-C Authorized by: Lissa Hoard, PA-C   Consent:    Consent obtained:  Verbal   Consent given by:  Patient   Risks, benefits, and alternatives were discussed: yes     Risks discussed:  Poor wound healing   Alternatives discussed:  No treatment Universal protocol:    Site/side marked: yes     Patient identity confirmed:  Verbally with patient Anesthesia:    Anesthesia method:  None Laceration details:    Location:  Toe   Toe location:  R big toe   Length (cm):  2   Depth (mm):  2 Exploration:    Limited defect created (wound extended): no     Contaminated: no   Treatment:    Area cleansed with:  Saline   Amount of cleaning:  Standard  Irrigation solution:  Sterile saline   Irrigation volume:  10   Irrigation method:  Syringe   Debridement:  None   Undermining:  None   Scar revision: no   Skin repair:    Repair method:  Tissue adhesive Approximation:    Approximation:  Close Repair type:    Repair type:  Simple Post-procedure details:    Dressing:  Non-adherent dressing   Procedure completion:  Tolerated well, no immediate complications    MEDICATIONS ORDERED IN ED: Medications - No data to display   IMPRESSION / MDM / ASSESSMENT AND PLAN / ED COURSE  I reviewed the triage vital signs and the nursing notes.                              Differential diagnosis includes, but is not limited to, ingrown toenail, toe laceration, toe contusion, skin tear, callus, corn  Patient's presentation is most consistent with acute, uncomplicated illness.  Patient's diagnosis is consistent with skin tear to the lateral great toe at the callus.  The free hanging callus skin was scabbed away, and Dermabond glue patch  was plied.  Patient will be discharged home with wound care instructions. Patient is to follow up with her podiatrist for any ongoing nail care needs as discussed, as needed or otherwise directed. Patient is given ED precautions to return to the ED for any worsening or new symptoms.  FINAL CLINICAL IMPRESSION(S) / ED DIAGNOSES   Final diagnoses:  Skin tear of right elbow without complication, initial encounter     Rx / DC Orders   ED Discharge Orders     None        Note:  This document was prepared using Dragon voice recognition software and may include unintentional dictation errors.    Lissa Hoard, PA-C 07/21/23 2309    Merwyn Katos, MD 07/21/23 [phone]

## 2023-07-22 NOTE — Group Note (Deleted)

## 2023-07-23 DIAGNOSIS — K449 Diaphragmatic hernia without obstruction or gangrene: Secondary | ICD-10-CM | POA: Diagnosis not present

## 2023-07-23 DIAGNOSIS — Z0000 Encounter for general adult medical examination without abnormal findings: Secondary | ICD-10-CM | POA: Diagnosis not present

## 2023-07-23 DIAGNOSIS — I10 Essential (primary) hypertension: Secondary | ICD-10-CM | POA: Diagnosis not present

## 2023-07-23 DIAGNOSIS — L03031 Cellulitis of right toe: Secondary | ICD-10-CM | POA: Diagnosis not present

## 2023-07-23 DIAGNOSIS — L600 Ingrowing nail: Secondary | ICD-10-CM | POA: Diagnosis not present

## 2023-07-23 DIAGNOSIS — R7309 Other abnormal glucose: Secondary | ICD-10-CM | POA: Diagnosis not present

## 2023-07-30 ENCOUNTER — Encounter: Payer: PPO | Admitting: Dermatology

## 2023-07-30 DIAGNOSIS — E1142 Type 2 diabetes mellitus with diabetic polyneuropathy: Secondary | ICD-10-CM | POA: Diagnosis not present

## 2023-07-30 DIAGNOSIS — S91111A Laceration without foreign body of right great toe without damage to nail, initial encounter: Secondary | ICD-10-CM | POA: Diagnosis not present

## 2023-11-04 DIAGNOSIS — N1832 Chronic kidney disease, stage 3b: Secondary | ICD-10-CM | POA: Diagnosis not present

## 2023-11-04 DIAGNOSIS — U071 COVID-19: Secondary | ICD-10-CM | POA: Diagnosis not present

## 2023-11-04 DIAGNOSIS — I10 Essential (primary) hypertension: Secondary | ICD-10-CM | POA: Diagnosis not present

## 2023-11-04 DIAGNOSIS — J40 Bronchitis, not specified as acute or chronic: Secondary | ICD-10-CM | POA: Diagnosis not present

## 2023-11-04 DIAGNOSIS — Z03818 Encounter for observation for suspected exposure to other biological agents ruled out: Secondary | ICD-10-CM | POA: Diagnosis not present

## 2023-11-11 DIAGNOSIS — Z03818 Encounter for observation for suspected exposure to other biological agents ruled out: Secondary | ICD-10-CM | POA: Diagnosis not present

## 2023-11-18 DIAGNOSIS — I10 Essential (primary) hypertension: Secondary | ICD-10-CM | POA: Diagnosis not present

## 2023-11-18 DIAGNOSIS — K449 Diaphragmatic hernia without obstruction or gangrene: Secondary | ICD-10-CM | POA: Diagnosis not present

## 2023-11-18 DIAGNOSIS — L03031 Cellulitis of right toe: Secondary | ICD-10-CM | POA: Diagnosis not present

## 2023-11-18 DIAGNOSIS — R7309 Other abnormal glucose: Secondary | ICD-10-CM | POA: Diagnosis not present

## 2023-11-18 DIAGNOSIS — Z0000 Encounter for general adult medical examination without abnormal findings: Secondary | ICD-10-CM | POA: Diagnosis not present

## 2023-11-18 DIAGNOSIS — L600 Ingrowing nail: Secondary | ICD-10-CM | POA: Diagnosis not present

## 2023-11-25 DIAGNOSIS — N1832 Chronic kidney disease, stage 3b: Secondary | ICD-10-CM | POA: Diagnosis not present

## 2023-11-25 DIAGNOSIS — R7309 Other abnormal glucose: Secondary | ICD-10-CM | POA: Diagnosis not present

## 2023-11-25 DIAGNOSIS — M722 Plantar fascial fibromatosis: Secondary | ICD-10-CM | POA: Diagnosis not present

## 2023-11-25 DIAGNOSIS — Z0000 Encounter for general adult medical examination without abnormal findings: Secondary | ICD-10-CM | POA: Diagnosis not present

## 2023-11-25 DIAGNOSIS — L03031 Cellulitis of right toe: Secondary | ICD-10-CM | POA: Diagnosis not present

## 2023-11-25 DIAGNOSIS — L600 Ingrowing nail: Secondary | ICD-10-CM | POA: Diagnosis not present

## 2023-11-25 DIAGNOSIS — G608 Other hereditary and idiopathic neuropathies: Secondary | ICD-10-CM | POA: Diagnosis not present

## 2023-11-25 DIAGNOSIS — K449 Diaphragmatic hernia without obstruction or gangrene: Secondary | ICD-10-CM | POA: Diagnosis not present

## 2023-11-25 DIAGNOSIS — L603 Nail dystrophy: Secondary | ICD-10-CM | POA: Diagnosis not present

## 2023-11-25 DIAGNOSIS — I10 Essential (primary) hypertension: Secondary | ICD-10-CM | POA: Diagnosis not present

## 2023-11-25 DIAGNOSIS — E1142 Type 2 diabetes mellitus with diabetic polyneuropathy: Secondary | ICD-10-CM | POA: Diagnosis not present

## 2023-12-12 ENCOUNTER — Ambulatory Visit: Payer: PPO | Admitting: Podiatry

## 2023-12-12 ENCOUNTER — Telehealth: Payer: Self-pay | Admitting: Podiatry

## 2023-12-12 DIAGNOSIS — M722 Plantar fascial fibromatosis: Secondary | ICD-10-CM

## 2023-12-12 NOTE — Progress Notes (Signed)
  Subjective:  Patient ID: Alexis Neal, female    DOB: [birth year],  MRN: [id number]  Chief Complaint  Patient presents with   Foot Pain    RM8: New pt-plater fascitis diabetic neuropathy HX of right foot and ankle pain    88 y.o. female presents with the above complaint.  Patient presents with left midfoot plantar fasciitis painful to touch is progressive gotten worse worse with ambulation worse with pressure she would like to discuss treatment options for she has not seen and was prior to seeing me denies any other acute complaints pain scale 7 out of 10 dull aching nature.   Review of Systems: Negative except as noted in the HPI. Denies N/V/F/Ch.  Past Medical History:  Diagnosis Date   Female bladder prolapse    Hx of basal cell carcinoma    R arm, txted by Dr. Jarold Motto   Hx of basal cell carcinoma    L upper eyelid txted by Dr. Cherlynn Kaiser   Hypertension    Nerve compression     Current Outpatient Medications:    gabapentin (NEURONTIN) 100 MG capsule, Take by mouth., Disp: , Rfl:    losartan (COZAAR) 50 MG tablet, Take 50 mg by mouth daily., Disp: , Rfl:    mometasone (ELOCON) 0.1 % cream, Apply to aa's rash QD-BID PRN., Disp: 45 g, Rfl: 3  Social History   Tobacco Use  Smoking Status Former  Smokeless Tobacco Never    No Known Allergies Objective:  There were no vitals filed for this visit. There is no height or weight on file to calculate BMI. Constitutional Well developed. Well nourished.  Vascular Dorsalis pedis pulses palpable bilaterally. Posterior tibial pulses palpable bilaterally. Capillary refill normal to all digits.  No cyanosis or clubbing noted. Pedal hair growth normal.  Neurologic Normal speech. Oriented to person, place, and time. Epicritic sensation to light touch grossly present bilaterally.  Dermatologic Nails well groomed and normal in appearance. No open wounds. No skin lesions.  Orthopedic: Normal joint ROM without pain or crepitus  bilaterally. No visible deformities. Tender to palpation at the mid foot of the left foot No pain with calcaneal squeeze left. Ankle ROM diminished range of motion left. Silfverskiold Test: positive left.   Radiographs: None  Assessment:  No diagnosis found. Plan:  Patient was evaluated and treated and all questions answered.  Plantar Fasciitis, left at midfoot - XR reviewed as above.  - Educated on icing and stretching. Instructions given.  - Injection delivered to the plantar fascia as below. - DME: Plantar fascial brace dispensed to support the medial longitudinal arch of the foot and offload pressure from the heel and prevent arch collapse during weightbearing - Pharmacologic management: None  Procedure: Injection Tendon/Ligament Location: Left plantar fascia at the glabrous junction; medial approach. Skin Prep: alcohol Injectate: 0.5 cc 0.5% marcaine plain, 0.5 cc of 1% Lidocaine, 0.5 cc kenalog 10. Disposition: Patient tolerated procedure well. Injection site dressed with a band-aid.  No follow-ups on file.

## 2023-12-12 NOTE — Telephone Encounter (Signed)
Patient is requesting to speak with nurse or provider regarding the name or type of shot given at today's visit. Patient contact telephone number. [phone]

## 2024-03-17 DIAGNOSIS — L603 Nail dystrophy: Secondary | ICD-10-CM | POA: Diagnosis not present

## 2024-03-17 DIAGNOSIS — G608 Other hereditary and idiopathic neuropathies: Secondary | ICD-10-CM | POA: Diagnosis not present

## 2024-03-17 DIAGNOSIS — I10 Essential (primary) hypertension: Secondary | ICD-10-CM | POA: Diagnosis not present

## 2024-03-17 DIAGNOSIS — R7309 Other abnormal glucose: Secondary | ICD-10-CM | POA: Diagnosis not present

## 2024-03-17 DIAGNOSIS — M722 Plantar fascial fibromatosis: Secondary | ICD-10-CM | POA: Diagnosis not present

## 2024-03-24 DIAGNOSIS — R7309 Other abnormal glucose: Secondary | ICD-10-CM | POA: Diagnosis not present

## 2024-03-24 DIAGNOSIS — Z135 Encounter for screening for eye and ear disorders: Secondary | ICD-10-CM | POA: Diagnosis not present

## 2024-03-24 DIAGNOSIS — I10 Essential (primary) hypertension: Secondary | ICD-10-CM | POA: Diagnosis not present

## 2024-03-24 DIAGNOSIS — K219 Gastro-esophageal reflux disease without esophagitis: Secondary | ICD-10-CM | POA: Diagnosis not present

## 2024-03-24 DIAGNOSIS — G608 Other hereditary and idiopathic neuropathies: Secondary | ICD-10-CM | POA: Diagnosis not present

## 2024-03-24 DIAGNOSIS — Z0000 Encounter for general adult medical examination without abnormal findings: Secondary | ICD-10-CM | POA: Diagnosis not present

## 2024-03-31 DIAGNOSIS — Z961 Presence of intraocular lens: Secondary | ICD-10-CM | POA: Diagnosis not present

## 2024-03-31 DIAGNOSIS — H353114 Nonexudative age-related macular degeneration, right eye, advanced atrophic with subfoveal involvement: Secondary | ICD-10-CM | POA: Diagnosis not present

## 2024-03-31 DIAGNOSIS — H353222 Exudative age-related macular degeneration, left eye, with inactive choroidal neovascularization: Secondary | ICD-10-CM | POA: Diagnosis not present

## 2024-03-31 DIAGNOSIS — H26492 Other secondary cataract, left eye: Secondary | ICD-10-CM | POA: Diagnosis not present

## 2024-05-04 DIAGNOSIS — M898X1 Other specified disorders of bone, shoulder: Secondary | ICD-10-CM | POA: Diagnosis not present

## 2024-05-04 DIAGNOSIS — I10 Essential (primary) hypertension: Secondary | ICD-10-CM | POA: Diagnosis not present

## 2024-05-04 DIAGNOSIS — M2550 Pain in unspecified joint: Secondary | ICD-10-CM | POA: Diagnosis not present

## 2024-05-04 DIAGNOSIS — R7309 Other abnormal glucose: Secondary | ICD-10-CM | POA: Diagnosis not present

## 2024-05-06 DIAGNOSIS — Z7721 Contact with and (suspected) exposure to potentially hazardous body fluids: Secondary | ICD-10-CM | POA: Diagnosis not present

## 2024-05-06 DIAGNOSIS — S61239A Puncture wound without foreign body of unspecified finger without damage to nail, initial encounter: Secondary | ICD-10-CM | POA: Diagnosis not present

## 2024-05-12 ENCOUNTER — Other Ambulatory Visit: Payer: Self-pay

## 2024-05-12 ENCOUNTER — Emergency Department

## 2024-05-12 ENCOUNTER — Emergency Department
Admission: EM | Admit: 2024-05-12 | Discharge: 2024-05-12 | Disposition: A | Discharge status: Home / Self Care | Attending: Emergency Medicine | Admitting: Emergency Medicine

## 2024-05-12 ENCOUNTER — Encounter: Payer: Self-pay | Admitting: Emergency Medicine

## 2024-05-12 DIAGNOSIS — R0602 Shortness of breath: Secondary | ICD-10-CM | POA: Diagnosis not present

## 2024-05-12 DIAGNOSIS — R918 Other nonspecific abnormal finding of lung field: Secondary | ICD-10-CM | POA: Diagnosis not present

## 2024-05-12 DIAGNOSIS — I10 Essential (primary) hypertension: Secondary | ICD-10-CM | POA: Diagnosis not present

## 2024-05-12 DIAGNOSIS — R42 Dizziness and giddiness: Secondary | ICD-10-CM | POA: Diagnosis not present

## 2024-05-12 DIAGNOSIS — R0600 Dyspnea, unspecified: Secondary | ICD-10-CM | POA: Insufficient documentation

## 2024-05-12 LAB — BASIC METABOLIC PANEL WITH GFR
Anion gap: 11 (ref 5–15)
BUN: 25 mg/dL — ABNORMAL HIGH (ref 8–23)
CO2: 25 mmol/L (ref 22–32)
Calcium: 9.5 mg/dL (ref 8.9–10.3)
Chloride: 102 mmol/L (ref 98–111)
Creatinine, Ser: 1.28 mg/dL — ABNORMAL HIGH (ref 0.44–1.00)
GFR, Estimated: 41 mL/min — ABNORMAL LOW (ref >60)
Glucose, Bld: 97 mg/dL (ref 70–99)
Potassium: 4.1 mmol/L (ref 3.5–5.1)
Sodium: 138 mmol/L (ref 135–145)

## 2024-05-12 LAB — CBC
HCT: 37.5 % (ref 36.0–46.0)
Hemoglobin: 12.4 g/dL (ref 12.0–15.0)
MCH: 30.9 pg (ref 26.0–34.0)
MCHC: 33.1 g/dL (ref 30.0–36.0)
MCV: 93.5 fL (ref 80.0–100.0)
Platelets: 199 10*3/uL (ref 150–400)
RBC: 4.01 MIL/uL (ref 3.87–5.11)
RDW: 14 % (ref 11.5–15.5)
WBC: 8.4 10*3/uL (ref 4.0–10.5)
nRBC: 0 % (ref 0.0–0.2)

## 2024-05-12 LAB — TROPONIN I (HIGH SENSITIVITY): Troponin I (High Sensitivity): 7 ng/L (ref <18)

## 2024-05-12 LAB — BRAIN NATRIURETIC PEPTIDE: B Natriuretic Peptide: 143.5 pg/mL — ABNORMAL HIGH (ref 0.0–100.0)

## 2024-05-12 MED ORDER — CLONIDINE HCL 0.1 MG PO TABS
0.1000 mg | ORAL_TABLET | Freq: Once | ORAL | Status: AC
  Administered 2024-05-12: 0.1 mg via ORAL
  Filled 2024-05-12: qty 1

## 2024-05-12 MED ORDER — IPRATROPIUM-ALBUTEROL 0.5-2.5 (3) MG/3ML IN SOLN
3.0000 mL | Freq: Once | RESPIRATORY_TRACT | Status: AC
  Administered 2024-05-12: 3 mL via RESPIRATORY_TRACT
  Filled 2024-05-12: qty 3

## 2024-05-12 NOTE — ED Triage Notes (Signed)
 Patient to ED via POV for SOB and HTN. Pt reports SOB ongoing starting today and dizzy x2 days. BP at home 190/70-  took BP meds this AM. Also c/o left shoulder pain- was told she had pinched nerve by PCP. PT took 325 ASA PTA.

## 2024-05-12 NOTE — ED Notes (Signed)
 Pt up to bedside commode. Gait steady.

## 2024-05-12 NOTE — ED Provider Notes (Signed)
 [facility] Provider Note    Event Date/Time   First MD Initiated Contact with Patient 05/12/24 1902     (approximate)  History   Chief Complaint: Shortness of Breath  HPI  Alexis Neal is a 88 y.o. female with a past medical history of hypertension, presents to the emergency department for shortness of breath.  According to the patient for the past 2 days she has been feeling short of breath.  Patient states her blood pressure has been elevated around 200 at home, she took her blood pressure medications this morning.  Patient also states she has been experiencing some left shoulder pain but she is seeing her PCP for the same has been ongoing for months and has been told is likely a pinched nerve.  No nausea or vomiting.  No headache.  No weakness or numbness of any arm or leg.  No confusion.  Patient denies any anterior chest pain at any point.  Patient quit smoking in 1982.  Denies any known underlying COPD or CHF.  Physical Exam   Triage Vital Signs: ED Triage Vitals  Encounter Vitals Group     BP 05/12/24 1812 (!) 210/66     Girls Systolic BP Percentile --      Girls Diastolic BP Percentile --      Boys Systolic BP Percentile --      Boys Diastolic BP Percentile --      Pulse Rate 05/12/24 1812 (!) 49     Resp 05/12/24 1812 17     Temp 05/12/24 1812 97.7 F (36.5 C)     Temp Source 05/12/24 1812 Oral     SpO2 05/12/24 1812 100 %     Weight 05/12/24 1813 150 lb (68 kg)     Height 05/12/24 1813 5' 6 (1.676 m)     Head Circumference --      Peak Flow --      Pain Score 05/12/24 1812 7     Pain Loc --      Pain Education --      Exclude from Growth Chart --     Most recent vital signs: Vitals:   05/12/24 1812  BP: (!) 210/66  Pulse: (!) 49  Resp: 17  Temp: 97.7 F (36.5 C)  SpO2: 100%    General: Awake, no distress.  CV:  Good peripheral perfusion.  Regular rate and rhythm  Resp:  Normal effort.  Equal breath sounds bilaterally.  No  wheeze rales or rhonchi. Abd:  No distention.  Soft, nontender.  No rebound or guarding. Other:  No lower extremity edema or tenderness.   ED Results / Procedures / Treatments   EKG  EKG viewed and interpreted by myself shows sinus bradycardia 57 bpm with a narrow QRS, normal axis, normal intervals, no concerning ST changes.  RADIOLOGY  I have reviewed interpret the chest x-ray images.  No obvious consolidation seen on my evaluation. Radiology has read mild hyperinflation and bronchial thickening but no acute finding   MEDICATIONS ORDERED IN ED: Medications  ipratropium-albuterol (DUONEB) 0.5-2.5 (3) MG/3ML nebulizer solution 3 mL (has no administration in time range)  cloNIDine (CATAPRES) tablet 0.1 mg (has no administration in time range)     IMPRESSION / MDM / ASSESSMENT AND PLAN / ED COURSE  I reviewed the triage vital signs and the nursing notes.  Patient's presentation is most consistent with acute presentation with potential threat to life or bodily function.  Patient presents to the  emergency department for shortness of breath over the last 2 days.  Patient also states she has been experiencing intermittent pain in her left shoulder blade but this has been ongoing times several months has seen her doctor for the same and told she likely had a pinched nerve.  Patient states her blood pressure has been elevated it was 190s at home.  Currently 210/66.  Patient states she has been under some stress.  Patient took her blood pressure medication this morning.  Given the elevated blood pressure we will dose a one-time dose of clonidine to see if this helps improve the patient's blood pressure.  Patient CBC is normal, chemistry shows no significant finding.  Chest x-ray is clear and EKG shows no concerning changes.  I have added on a troponin as well as a BNP as a precaution to further workup.  Will continue to closely monitor while awaiting these results.  Will dose a breathing treatment  as well as clonidine while awaiting labs.  Patient's workup is reassuring, chemistry shows no significant finding.  CBC normal to normal white blood cell count, troponin is negative.  BNP slightly elevated at 140.  Patient's chest x-ray shows no acute findings.  After clonidine patient's blood pressure is down to around 170 systolic.  Patient states she is feeling much better.  Denies any shortness of breath currently.  Patient states he is still able to lie flat at night when she sleeps.  Will have the patient follow-up with her doctor.  She will continue to take her blood pressure medication in the morning.  FINAL CLINICAL IMPRESSION(S) / ED DIAGNOSES   Hypertension Dyspnea    Note:  This document was prepared using Dragon voice recognition software and may include unintentional dictation errors.   [name], MD 05/12/24 2113

## 2024-05-14 DIAGNOSIS — R457 State of emotional shock and stress, unspecified: Secondary | ICD-10-CM | POA: Diagnosis not present

## 2024-05-14 DIAGNOSIS — I10 Essential (primary) hypertension: Secondary | ICD-10-CM | POA: Diagnosis not present

## 2024-05-14 DIAGNOSIS — F411 Generalized anxiety disorder: Secondary | ICD-10-CM | POA: Diagnosis not present

## 2024-05-15 ENCOUNTER — Emergency Department
Admission: EM | Admit: 2024-05-15 | Discharge: 2024-05-15 | Disposition: A | Discharge status: Home / Self Care | Attending: Emergency Medicine | Admitting: Emergency Medicine

## 2024-05-15 ENCOUNTER — Emergency Department

## 2024-05-15 ENCOUNTER — Other Ambulatory Visit: Payer: Self-pay

## 2024-05-15 DIAGNOSIS — R079 Chest pain, unspecified: Secondary | ICD-10-CM | POA: Diagnosis not present

## 2024-05-15 DIAGNOSIS — R0789 Other chest pain: Secondary | ICD-10-CM | POA: Diagnosis not present

## 2024-05-15 DIAGNOSIS — I10 Essential (primary) hypertension: Secondary | ICD-10-CM | POA: Diagnosis not present

## 2024-05-15 DIAGNOSIS — M549 Dorsalgia, unspecified: Secondary | ICD-10-CM | POA: Diagnosis not present

## 2024-05-15 LAB — CBC
HCT: 38.1 % (ref 36.0–46.0)
Hemoglobin: 12.5 g/dL (ref 12.0–15.0)
MCH: 30.9 pg (ref 26.0–34.0)
MCHC: 32.8 g/dL (ref 30.0–36.0)
MCV: 94.3 fL (ref 80.0–100.0)
Platelets: 211 K/uL (ref 150–400)
RBC: 4.04 MIL/uL (ref 3.87–5.11)
RDW: 14.1 % (ref 11.5–15.5)
WBC: 9.6 K/uL (ref 4.0–10.5)
nRBC: 0 % (ref 0.0–0.2)

## 2024-05-15 LAB — BASIC METABOLIC PANEL WITH GFR
Anion gap: 9 (ref 5–15)
BUN: 25 mg/dL — ABNORMAL HIGH (ref 8–23)
CO2: 25 mmol/L (ref 22–32)
Calcium: 9.4 mg/dL (ref 8.9–10.3)
Chloride: 103 mmol/L (ref 98–111)
Creatinine, Ser: 1.25 mg/dL — ABNORMAL HIGH (ref 0.44–1.00)
GFR, Estimated: 42 mL/min — ABNORMAL LOW (ref >60)
Glucose, Bld: 116 mg/dL — ABNORMAL HIGH (ref 70–99)
Potassium: 4.6 mmol/L (ref 3.5–5.1)
Sodium: 137 mmol/L (ref 135–145)

## 2024-05-15 LAB — TROPONIN I (HIGH SENSITIVITY)
Troponin I (High Sensitivity): 5 ng/L (ref <18)
Troponin I (High Sensitivity): 5 ng/L (ref <18)

## 2024-05-15 MED ORDER — LOSARTAN POTASSIUM 50 MG PO TABS: 50.0000 mg | ORAL_TABLET | Freq: Two times a day (BID) | ORAL | 0 refills | Status: AC

## 2024-05-15 NOTE — ED Triage Notes (Signed)
 Pt to ED from home with chest/back heat/pain as well as nausea since this morning. Pt just started Amlodipine and Escitalopram and states she has not felt right since taking that dose.   No SOB A&O x4

## 2024-05-15 NOTE — ED Notes (Signed)
 ED Provider at bedside.

## 2024-05-15 NOTE — Discharge Instructions (Addendum)
 Please discontinue your new blood pressure medicine amlodipine as well as the antidepressant escitalopram as suspect one of them may have caused your symptoms today.  Please call Dr. Sadie Monday for follow-up.

## 2024-05-15 NOTE — ED Provider Notes (Signed)
 [facility] Provider Note    Event Date/Time   First MD Initiated Contact with Patient 05/15/24 1047     (approximate)   History   Chest Pain   HPI  Alexis Neal is a 88 y.o. female reports she started feeling a burning heat like feeling this morning.  She reports that she has always had chronic back pain for several months, and she has been having issues of high blood pressure.  She was in the ER few days ago and saw her primary care doctor yesterday and reports this morning she started amlodipine and Lexapro for the first time, within about 30 minutes to an hour she started feeling like a burning sensation across her chest and upper back.  She otherwise feels well no shortness of breath no swelling no rashes no trouble breathing or wheezing.     Physical Exam   Triage Vital Signs: ED Triage Vitals  Encounter Vitals Group     BP 05/15/24 1012 (!) 173/70     Girls Systolic BP Percentile --      Girls Diastolic BP Percentile --      Boys Systolic BP Percentile --      Boys Diastolic BP Percentile --      Pulse Rate 05/15/24 1012 (!) 54     Resp 05/15/24 1012 18     Temp 05/15/24 1009 97.8 F (36.6 C)     Temp Source 05/15/24 1009 Oral     SpO2 05/15/24 1012 99 %     Weight 05/15/24 1009 151 lb (68.5 kg)     Height 05/15/24 1009 5' 6 (1.676 m)     Head Circumference --      Peak Flow --      Pain Score --      Pain Loc --      Pain Education --      Exclude from Growth Chart --     Most recent vital signs: Vitals:   05/15/24 1435 05/15/24 1500  BP: (!) 180/57 (!) 181/64  Pulse: (!) 55 60  Resp: (!) 22 (!) 22  Temp: 97.9 F (36.6 C)   SpO2: 100% 100%     General: Awake, no distress.  CV:  Good peripheral perfusion.  Normal tones rate variable from 45-55 Resp:  Normal effort.  Clear bilateral Abd:  No distention.  Soft nontender nondistended Other:  No rash.  No wheezing.  No swelling or edema anywhere.  Speaks in full clear  sentences   ED Results / Procedures / Treatments   Labs (all labs ordered are listed, but only abnormal results are displayed) Labs Reviewed  BASIC METABOLIC PANEL WITH GFR - Abnormal; Notable for the following components:      Result Value   Glucose, Bld 116 (*)    BUN 25 (*)    Creatinine, Ser 1.25 (*)    GFR, Estimated 42 (*)    All other components within normal limits  CBC  TROPONIN I (HIGH SENSITIVITY)  TROPONIN I (HIGH SENSITIVITY)     EKG  And interpreted by me at 1050 Sinus bradycardia.  Heart rate 50 QRS 120 QRS 80 QTc 400.  Normal sinus rhythm.  No evidence of acute ischemia.   RADIOLOGY Chest x-ray inter by me is negative for acute finding   DG Chest 2 View Result Date: 05/15/2024 CLINICAL DATA:  88 year old female with chest and back pain. Former smoker. EXAM: CHEST - 2 VIEW COMPARISON:  Chest radiograph  05/12/2024 and earlier. FINDINGS: PA and lateral views of the chest 1020 hours. Chronic large lung volumes, symmetric increased pulmonary interstitial markings. Stable cardiac size and mediastinal contours. Cardiac size at the upper limits of normal. Visualized tracheal air column is within normal limits. No pneumothorax, pulmonary edema, pleural effusion, acute lung opacity. No acute osseous abnormality identified. Spine degeneration. Negative visible bowel gas. IMPRESSION: Chronic pulmonary hyperinflation and interstitial lung changes. No acute cardiopulmonary abnormality. Electronically Signed   By: VEAR Hurst M.D.   On: 05/15/2024 10:41       PROCEDURES:  Critical Care performed: No  Procedures   MEDICATIONS ORDERED IN ED: Medications - No data to display   IMPRESSION / MDM / ASSESSMENT AND PLAN / ED COURSE  I reviewed the triage vital signs and the nursing notes.                              Differential diagnosis includes, but is not limited to, potential adverse reaction to medication, chronic back pain, nerve impingement, neuropathic pain etc.   Symptoms are very atypical of ACS.  She does have significant hypertension but this is no and she does not have any ripping tearing or moving component of pain and has chronic upper back pain which she reports is not new for her and has been previously evaluated and documented.  No hypoxia no tachycardia no dyspnea.  No findings would be suggestive of pneumothorax, infection, pneumonia etc.  ECG reassuring with mild bradycardia, 2 troponins both normal  Patient's presentation is most consistent with acute complicated illness / injury requiring diagnostic workup.  Reviewed and given the patient's recent hypertension we will increase her losartan  dose to 50 mg twice daily and she will discontinue amlodipine and Lexapro.  She will talk to Dr. Clemencia office Monday. Return precautions and treatment recommendations and follow-up discussed with the patient who is agreeable with the plan.  Both patient and her friend were with her very agreeable with this plan        FINAL CLINICAL IMPRESSION(S) / ED DIAGNOSES   Final diagnoses:  Hypertension, unspecified type  Atypical chest pain     Rx / DC Orders   ED Discharge Orders          Ordered    losartan  (COZAAR ) 50 MG tablet  2 times daily        05/15/24 1452             Note:  This document was prepared using Dragon voice recognition software and may include unintentional dictation errors.   Dicky Anes, MD 05/15/24 [phone]

## 2024-05-18 DIAGNOSIS — G608 Other hereditary and idiopathic neuropathies: Secondary | ICD-10-CM | POA: Diagnosis not present

## 2024-05-18 DIAGNOSIS — R7309 Other abnormal glucose: Secondary | ICD-10-CM | POA: Diagnosis not present

## 2024-05-18 DIAGNOSIS — F411 Generalized anxiety disorder: Secondary | ICD-10-CM | POA: Diagnosis not present

## 2024-05-18 DIAGNOSIS — R001 Bradycardia, unspecified: Secondary | ICD-10-CM | POA: Diagnosis not present

## 2024-05-18 DIAGNOSIS — I10 Essential (primary) hypertension: Secondary | ICD-10-CM | POA: Diagnosis not present

## 2024-05-18 DIAGNOSIS — R0789 Other chest pain: Secondary | ICD-10-CM | POA: Diagnosis not present

## 2024-05-18 DIAGNOSIS — M542 Cervicalgia: Secondary | ICD-10-CM | POA: Diagnosis not present

## 2024-05-25 ENCOUNTER — Other Ambulatory Visit: Payer: Self-pay

## 2024-05-25 ENCOUNTER — Emergency Department
Admission: EM | Admit: 2024-05-25 | Discharge: 2024-05-25 | Disposition: A | Discharge status: Home / Self Care | Attending: Emergency Medicine | Admitting: Emergency Medicine

## 2024-05-25 ENCOUNTER — Emergency Department

## 2024-05-25 DIAGNOSIS — I10 Essential (primary) hypertension: Secondary | ICD-10-CM | POA: Diagnosis not present

## 2024-05-25 DIAGNOSIS — R0789 Other chest pain: Secondary | ICD-10-CM | POA: Insufficient documentation

## 2024-05-25 DIAGNOSIS — J439 Emphysema, unspecified: Secondary | ICD-10-CM | POA: Diagnosis not present

## 2024-05-25 DIAGNOSIS — R079 Chest pain, unspecified: Secondary | ICD-10-CM | POA: Diagnosis not present

## 2024-05-25 LAB — CBC
HCT: 38.4 % (ref 36.0–46.0)
Hemoglobin: 12.8 g/dL (ref 12.0–15.0)
MCH: 31 pg (ref 26.0–34.0)
MCHC: 33.3 g/dL (ref 30.0–36.0)
MCV: 93 fL (ref 80.0–100.0)
Platelets: 232 K/uL (ref 150–400)
RBC: 4.13 MIL/uL (ref 3.87–5.11)
RDW: 14.1 % (ref 11.5–15.5)
WBC: 8.9 K/uL (ref 4.0–10.5)
nRBC: 0 % (ref 0.0–0.2)

## 2024-05-25 LAB — TROPONIN I (HIGH SENSITIVITY)
Troponin I (High Sensitivity): 7 ng/L (ref <18)
Troponin I (High Sensitivity): 7 ng/L (ref <18)

## 2024-05-25 LAB — BASIC METABOLIC PANEL WITH GFR
Anion gap: 8 (ref 5–15)
BUN: 28 mg/dL — ABNORMAL HIGH (ref 8–23)
CO2: 28 mmol/L (ref 22–32)
Calcium: 10.3 mg/dL (ref 8.9–10.3)
Chloride: 103 mmol/L (ref 98–111)
Creatinine, Ser: 1.72 mg/dL — ABNORMAL HIGH (ref 0.44–1.00)
GFR, Estimated: 29 mL/min — ABNORMAL LOW (ref >60)
Glucose, Bld: 155 mg/dL — ABNORMAL HIGH (ref 70–99)
Potassium: 4 mmol/L (ref 3.5–5.1)
Sodium: 139 mmol/L (ref 135–145)

## 2024-05-25 NOTE — ED Notes (Signed)
 Pt discharged to ED circle at this time and left with all belongings. Pt ABCs intact. RR even and unlabored. Pt in NAD. Pt denies further needs from this RN.

## 2024-05-25 NOTE — ED Provider Notes (Signed)
 [facility] Provider Note   Event Date/Time   First MD Initiated Contact with Patient 05/25/24 1939     (approximate) History  Chest Pain  HPI Alexis Neal is a 88 y.o. female with a past medical history of hypertension who presents complaining of left-sided chest pain that occurs intermittently for 2 to 4 minutes and resolved spontaneously.  This pain does not radiate.  This pain does not have any exacerbating or relieving factors.  Patient does not take any medications for this pain as it resolves quickly.  Patient denies any associated shortness of breath.  Patient denies any palpitations ROS: Patient currently denies any vision changes, tinnitus, difficulty speaking, facial droop, sore throat, shortness of breath, abdominal pain, nausea/vomiting/diarrhea, dysuria, or weakness/numbness/paresthesias in any extremity   Physical Exam  Triage Vital Signs: ED Triage Vitals  Encounter Vitals Group     BP 05/25/24 1709 (!) 144/119     Girls Systolic BP Percentile --      Girls Diastolic BP Percentile --      Boys Systolic BP Percentile --      Boys Diastolic BP Percentile --      Pulse Rate 05/25/24 1709 (!) 53     Resp 05/25/24 1709 17     Temp 05/25/24 1709 98 F (36.7 C)     Temp Source 05/25/24 1709 Oral     SpO2 05/25/24 1709 97 %     Weight 05/25/24 1710 151 lb (68.5 kg)     Height 05/25/24 1710 5' 6 (1.676 m)     Head Circumference --      Peak Flow --      Pain Score 05/25/24 1709 0     Pain Loc --      Pain Education --      Exclude from Growth Chart --    Most recent vital signs: Vitals:   05/25/24 1709  BP: (!) 144/119  Pulse: (!) 53  Resp: 17  Temp: 98 F (36.7 C)  SpO2: 97%   General: Awake, oriented x4. CV:  Good peripheral perfusion. Resp:  Normal effort. Abd:  No distention. Other:  Elderly well-developed, well-nourished Caucasian female resting comfortably in no acute distress ED Results / Procedures / Treatments   Labs (all labs ordered are listed, but only abnormal results are displayed) Labs Reviewed  BASIC METABOLIC PANEL WITH GFR - Abnormal; Notable for the following components:      Result Value   Glucose, Bld 155 (*)    BUN 28 (*)    Creatinine, Ser 1.72 (*)    GFR, Estimated 29 (*)    All other components within normal limits  CBC  TROPONIN I (HIGH SENSITIVITY)  TROPONIN I (HIGH SENSITIVITY)   EKG ED ECG REPORT I, Artist MARLA Kerns, the attending physician, personally viewed and interpreted this ECG. Date: 05/25/2024 EKG Time: 1712 Rate: 54 Rhythm: Bradycardic sinus rhythm QRS Axis: normal Intervals: normal ST/T Wave abnormalities: normal Narrative Interpretation: Bradycardic sinus rhythm with premature atrial complexes.  No evidence of acute ischemia RADIOLOGY ED MD interpretation: 2 view chest x-ray interpreted by me shows no evidence of acute abnormalities including no pneumonia, pneumothorax, or widened mediastinum - All radiology independently interpreted and agree with radiology assessment Official radiology report(s): DG Chest 2 View Result Date: 05/25/2024 CLINICAL DATA:  chest pain EXAM: CHEST - 2 VIEW COMPARISON:  Chest x-ray 05/15/2024., CT C-spine 12/21/2017 FINDINGS: The heart and mediastinal contours are within normal limits. Hyperinflation of the  lungs with flattening of the hemidiaphragms. No focal consolidation. No pulmonary edema. Nonspecific blunting of bilateral costophrenic angles. No pleural effusion. No pneumothorax. No acute osseous abnormality. IMPRESSION: 1. No active cardiopulmonary disease. 2.  Emphysema (ICD10-J43.9). Electronically Signed   By: Morgane  Naveau M.D.   On: 05/25/2024 17:47   PROCEDURES: Critical Care performed: No Procedures MEDICATIONS ORDERED IN ED: Medications - No data to display IMPRESSION / MDM / ASSESSMENT AND PLAN / ED COURSE  I reviewed the triage vital signs and the nursing notes.                             The patient is on  the cardiac monitor to evaluate for evidence of arrhythmia and/or significant heart rate changes. Patient's presentation is most consistent with acute presentation with potential threat to life or bodily function. This patient presents with atypical chest pain, most likely secondary to musculoskeletal injury. Differential diagnosis includes rib fracture, costochondritis, sternal fracture. Low suspicion for ACS, acute PE (PERC negative), pericarditis / myocarditis, thoracic aortic dissection, pneumothorax, pneumonia or other acute infectious process. Presentation not consistent with other acute, emergent causes of chest pain at this time. No indication for cardiac enzyme testing. Plan to order CXR to evaluate for acute cardiopulmonary causes.  Plan: EKG, CXR, pain control  Dispo: Discharge home with home care   FINAL CLINICAL IMPRESSION(S) / ED DIAGNOSES   Final diagnoses:  Intermittent left-sided chest pain   Rx / DC Orders   ED Discharge Orders          Ordered    Ambulatory referral to Cardiology       Comments: If you have not heard from the Cardiology office within the next 72 hours please call [phone].   05/25/24 2054           Note:  This document was prepared using Dragon voice recognition software and may include unintentional dictation errors.   Langley Flatley K, MD 05/25/24 2055

## 2024-05-25 NOTE — ED Triage Notes (Signed)
 Pt sts that she has been having chest pain and then pain under her right breast since this AM.

## 2024-05-25 NOTE — ED Notes (Signed)
 Pt reporting to ED d/t intermittent CP for about 1 month. Pt states that the pain is to the lower L chest. Pt denies SOB but says that her BP has been high lately. Pt has been following up with primary care and trying to get her BP under control, but has not been successful despite taking prescribed medications. Pt ABCs intact. RR even and unlabored. Pt in NAD. Bed in lowest locked position. Call bell in reach. Denies needs at this time.   Past Medical History:  Diagnosis Date   Female bladder prolapse    Hx of basal cell carcinoma    R arm, txted by Dr. Jakie   Hx of basal cell carcinoma    L upper eyelid txted by Dr. Travis   Hypertension    Nerve compression

## 2024-05-27 ENCOUNTER — Other Ambulatory Visit: Payer: Self-pay | Admitting: Internal Medicine

## 2024-05-27 DIAGNOSIS — R0602 Shortness of breath: Secondary | ICD-10-CM | POA: Diagnosis not present

## 2024-05-27 DIAGNOSIS — I10 Essential (primary) hypertension: Secondary | ICD-10-CM | POA: Diagnosis not present

## 2024-05-27 DIAGNOSIS — Z87891 Personal history of nicotine dependence: Secondary | ICD-10-CM | POA: Diagnosis not present

## 2024-05-27 DIAGNOSIS — R0789 Other chest pain: Secondary | ICD-10-CM

## 2024-05-27 DIAGNOSIS — K219 Gastro-esophageal reflux disease without esophagitis: Secondary | ICD-10-CM | POA: Diagnosis not present

## 2024-05-27 DIAGNOSIS — G608 Other hereditary and idiopathic neuropathies: Secondary | ICD-10-CM | POA: Diagnosis not present

## 2024-05-27 DIAGNOSIS — F411 Generalized anxiety disorder: Secondary | ICD-10-CM | POA: Diagnosis not present

## 2024-06-05 ENCOUNTER — Ambulatory Visit

## 2024-06-11 DIAGNOSIS — R0789 Other chest pain: Secondary | ICD-10-CM | POA: Diagnosis not present

## 2024-06-11 DIAGNOSIS — R0602 Shortness of breath: Secondary | ICD-10-CM | POA: Diagnosis not present

## 2024-06-25 ENCOUNTER — Ambulatory Visit: Admitting: Podiatry

## 2024-06-25 DIAGNOSIS — Q666 Other congenital valgus deformities of feet: Secondary | ICD-10-CM

## 2024-06-25 DIAGNOSIS — M722 Plantar fascial fibromatosis: Secondary | ICD-10-CM

## 2024-06-25 NOTE — Progress Notes (Signed)
  Subjective:  Patient ID: Alexis Neal, female    DOB: September 21, 1936,  MRN: [id number]  Chief Complaint  Patient presents with   Nail Problem    Nail trim     88 y.o. female presents with the above complaint.  Patient presents with complaint of left plantar fasciitis.  Patient states is painful to touch is progressive gotten worse she states the injection helped she would like to do another 1 she also would like to get orthotics   Review of Systems: Negative except as noted in the HPI. Denies N/V/F/Ch.  Past Medical History:  Diagnosis Date   Female bladder prolapse    Hx of basal cell carcinoma    R arm, txted by Dr. Jakie   Hx of basal cell carcinoma    L upper eyelid txted by Dr. Travis   Hypertension    Nerve compression     Current Outpatient Medications:    gabapentin (NEURONTIN) 100 MG capsule, Take by mouth., Disp: , Rfl:    losartan  (COZAAR ) 50 MG tablet, Take 1 tablet (50 mg total) by mouth 2 (two) times daily., Disp: 60 tablet, Rfl: 0   mometasone  (ELOCON ) 0.1 % cream, Apply to aa's rash QD-BID PRN., Disp: 45 g, Rfl: 3  Social History   Tobacco Use  Smoking Status Former  Smokeless Tobacco Never    No Known Allergies Objective:  There were no vitals filed for this visit. There is no height or weight on file to calculate BMI. Constitutional Well developed. Well nourished.  Vascular Dorsalis pedis pulses palpable bilaterally. Posterior tibial pulses palpable bilaterally. Capillary refill normal to all digits.  No cyanosis or clubbing noted. Pedal hair growth normal.  Neurologic Normal speech. Oriented to person, place, and time. Epicritic sensation to light touch grossly present bilaterally.  Dermatologic Nails well groomed and normal in appearance. No open wounds. No skin lesions.  Orthopedic: Normal joint ROM without pain or crepitus bilaterally. No visible deformities. Tender to palpation at the mid foot of the left foot No pain with calcaneal  squeeze left. Ankle ROM diminished range of motion left. Silfverskiold Test: positive left.   Radiographs: None  Assessment:   1. Plantar fasciitis of left foot   2. Pes planovalgus    Plan:  Patient was evaluated and treated and all questions answered.  Plantar Fasciitis, left at midfoot - XR reviewed as above.  - Educated on icing and stretching. Instructions given.  - Another injection delivered to the plantar fascia as below. - DME: Plantar fascial brace dispensed to support the medial longitudinal arch of the foot and offload pressure from the heel and prevent arch collapse during weightbearing - Pharmacologic management: None  Pes planovalgus -I explained to patient the etiology of pes planovalgus and relationship with Planter fasciitis and various treatment options were discussed.  Given patient foot structure in the setting of Planter fasciitis I believe patient will benefit from custom-made orthotics to help control the hindfoot motion support the arch of the foot and take the stress away from plantar fascial.  Patient agrees with the plan like to proceed with orthotics -Patient was casted for orthotics   Procedure: Injection Tendon/Ligament Location: Left plantar fascia at the glabrous junction; medial approach. Skin Prep: alcohol  Injectate: 0.5 cc 0.5% marcaine plain, 0.5 cc of 1% Lidocaine, 0.5 cc kenalog 10. Disposition: Patient tolerated procedure well. Injection site dressed with a band-aid.  Return in about 3 months (around 09/25/2024) for RFC .

## 2024-07-14 DIAGNOSIS — R0789 Other chest pain: Secondary | ICD-10-CM | POA: Diagnosis not present

## 2024-07-14 DIAGNOSIS — F411 Generalized anxiety disorder: Secondary | ICD-10-CM | POA: Diagnosis not present

## 2024-07-14 DIAGNOSIS — K219 Gastro-esophageal reflux disease without esophagitis: Secondary | ICD-10-CM | POA: Diagnosis not present

## 2024-07-14 DIAGNOSIS — I10 Essential (primary) hypertension: Secondary | ICD-10-CM | POA: Diagnosis not present

## 2024-07-14 DIAGNOSIS — R0602 Shortness of breath: Secondary | ICD-10-CM | POA: Diagnosis not present

## 2024-07-14 DIAGNOSIS — Z87891 Personal history of nicotine dependence: Secondary | ICD-10-CM | POA: Diagnosis not present

## 2024-07-23 ENCOUNTER — Other Ambulatory Visit

## 2024-07-30 DIAGNOSIS — J069 Acute upper respiratory infection, unspecified: Secondary | ICD-10-CM | POA: Diagnosis not present

## 2024-08-13 ENCOUNTER — Telehealth: Payer: Self-pay

## 2024-08-13 NOTE — Telephone Encounter (Signed)
 Orthotics arrived to the [employer] office  Balance for orthotics: $100.35  Financial form signed and on file

## 2024-08-17 NOTE — Telephone Encounter (Signed)
 Called and spoke with patient to schedule orthotic fitting/ pu  She said she told someone to cancel the order since she couldn't afford the orthotics. I told her a financial form has been signed and that we do offer payment plans. Appt set for 09/25/24 for fitting

## 2024-09-08 ENCOUNTER — Ambulatory Visit

## 2024-09-08 DIAGNOSIS — L720 Epidermal cyst: Secondary | ICD-10-CM | POA: Diagnosis not present

## 2024-09-08 DIAGNOSIS — L2081 Atopic neurodermatitis: Secondary | ICD-10-CM

## 2024-09-08 DIAGNOSIS — L570 Actinic keratosis: Secondary | ICD-10-CM

## 2024-09-08 DIAGNOSIS — L820 Inflamed seborrheic keratosis: Secondary | ICD-10-CM

## 2024-09-08 DIAGNOSIS — L814 Other melanin hyperpigmentation: Secondary | ICD-10-CM | POA: Diagnosis not present

## 2024-09-08 DIAGNOSIS — Z85820 Personal history of malignant melanoma of skin: Secondary | ICD-10-CM | POA: Diagnosis not present

## 2024-09-08 DIAGNOSIS — W908XXA Exposure to other nonionizing radiation, initial encounter: Secondary | ICD-10-CM

## 2024-09-08 DIAGNOSIS — L578 Other skin changes due to chronic exposure to nonionizing radiation: Secondary | ICD-10-CM

## 2024-09-08 MED ORDER — MOMETASONE FUROATE 0.1 % EX CREA: TOPICAL_CREAM | CUTANEOUS | 3 refills | Status: AC

## 2024-09-08 NOTE — Progress Notes (Signed)
 Subjective   Alexis Neal is a 88 y.o. female who presents for the following: Lesion(s) of concern . Patient is established patient   Today patient reports: Cyst of the R lower eyelid that she would like treated today, irregular scaly lesions on the face that is concerned may be cancerous, hx of atopic dermatitis with pruritus, pt would like refills of mometasone  0.1% cream.   Review of Systems:    No other skin or systemic complaints except as noted in HPI or Assessment and Plan.  The following portions of the chart were reviewed this encounter and updated as appropriate: medications, allergies, medical history  Relevant Medical History:  Personal history of non melanoma skin cancer - see medical history for full details   Objective  Well appearing patient in no apparent distress; mood and affect are within normal limits. Examination was performed of the: Focused Exam of: the face   Examination notable for: Seborrheic Keratosis(es): Stuck-on appearing keratotic papule(s) on the trunk, some  irritated with redness, crusting, edema, and/or partial avulsion, Actinic Damage/Elastosis: chronic sun damage: dyspigmentation, telangiectasia, and wrinkling, Actinic keratosis: Scaly erythematous macule(s) concentrated on sun exposed areas , Atopic dermatitis: Diffuse xerosis with erythematous plaques on the on the trunk and extremities, most prominently in the flexural areas with moderate lichenification, erythema, and pigment alteration focally  Cyst w central punctum R inferior eye  Examination limited by: n/a  Face x 3 (3) Erythematous thin papules/macules with gritty scale.  Right lower eyelid Cystic pap  Assessment & Plan   BENIGN SKIN FINDINGS  - Lentigines  - Seborrheic keratoses - Reassurance provided regarding the benign appearance of lesions noted on exam today; no treatment is indicated in the absence of symptoms/changes. - Reinforced importance of photoprotective strategies  including liberal and frequent sunscreen use of a broad-spectrum SPF 30 or greater, use of protective clothing, and sun avoidance for prevention of cutaneous malignancy and photoaging.  Counseled patient on the importance of regular self-skin monitoring as well as routine clinical skin examinations as scheduled.   ACTINIC DAMAGE - Chronic condition, secondary to cumulative UV/sun exposure - Recommend daily broad spectrum sunscreen SPF 30+ to sun-exposed areas, reapply every 2 hours as needed.  - Staying in the shade or wearing long sleeves, sun glasses (UVA+UVB protection) and wide brim hats (4-inch brim around the entire circumference of the hat) are also recommended for sun protection.  - Call for new or changing lesions.  Personal history of non melanoma skin cancer  - Reviewed medical history for full details  - Reviewed sun protective measures as above - Encouraged full body skin exams   Mild atopic neurodermatitis - chronic stable condition  - Refilled mometasone  cream   Procedures, orders, diagnosis for this visit:  AK (ACTINIC KERATOSIS) (3) Face x 3 (3)  Destruction of lesion - Face x 3 (3) Complexity: simple   Destruction method: cryotherapy   Informed consent: discussed and consent obtained   Timeout:  patient name, date of birth, surgical site, and procedure verified Lesion destroyed using liquid nitrogen: Yes   Region frozen until ice ball extended beyond lesion: Yes   Cryo cycles: 1 or 2. Outcome: patient tolerated procedure well with no complications   Post-procedure details: wound care instructions given    EPIDERMAL CYST Right lower eyelid  Incision and Drainage - Right lower eyelid Location: R lower eyelid  Informed Consent: Discussed risks (permanent scarring, light or dark discoloration, infection, pain, bleeding, bruising, redness, damage to adjacent structures,  and recurrence of the lesion) and benefits of the procedure, as well as the alternatives.   Informed consent was obtained.  Preparation: The area was prepped with alcohol .  Procedure Details: An incision was made overlying the lesion. The lesion drained pus and white, chalky cyst material.  A small amount of fluid was drained.    Antibiotic ointment and a sterile pressure dressing were applied. The patient tolerated procedure well.  Total number of lesions drained: 1  Plan: The patient was instructed on post-op care. Recommend OTC analgesia as needed for pain.   ATOPIC NEURODERMATITIS   SEBORRHEIC KERATOSIS, INFLAMED Head - Anterior (Face) Destruction of lesion - Head - Anterior (Face) Complexity: simple   Destruction method: cryotherapy   Informed consent: discussed and consent obtained   Timeout:  patient name, date of birth, surgical site, and procedure verified Lesion destroyed using liquid nitrogen: Yes   Region frozen until ice ball extended beyond lesion: Yes   Outcome: patient tolerated procedure well with no complications      Seborrheic keratosis, inflamed -     Destruction of lesion  AK (actinic keratosis) -     Destruction of lesion  Epidermal cyst -     Incision and Drainage  Atopic neurodermatitis  Other orders -     Mometasone  Furoate; Apply to aa's rash QD-BID PRN.  Dispense: 45 g; Refill: 3   Return to clinic: Return if symptoms worsen or fail to improve.  LILLETTE Rosina Mayans, CMA, am acting as scribe for Lauraine JAYSON Kanaris, MD .   Documentation: I have reviewed the above documentation for accuracy and completeness, and I agree with the above.  Lauraine JAYSON Kanaris, MD

## 2024-09-08 NOTE — Patient Instructions (Signed)

## 2024-09-17 DIAGNOSIS — K219 Gastro-esophageal reflux disease without esophagitis: Secondary | ICD-10-CM | POA: Diagnosis not present

## 2024-09-17 DIAGNOSIS — G608 Other hereditary and idiopathic neuropathies: Secondary | ICD-10-CM | POA: Diagnosis not present

## 2024-09-17 DIAGNOSIS — Z0000 Encounter for general adult medical examination without abnormal findings: Secondary | ICD-10-CM | POA: Diagnosis not present

## 2024-09-17 DIAGNOSIS — I10 Essential (primary) hypertension: Secondary | ICD-10-CM | POA: Diagnosis not present

## 2024-09-17 DIAGNOSIS — R7309 Other abnormal glucose: Secondary | ICD-10-CM | POA: Diagnosis not present

## 2024-09-24 DIAGNOSIS — F411 Generalized anxiety disorder: Secondary | ICD-10-CM | POA: Insufficient documentation

## 2024-09-24 DIAGNOSIS — M150 Primary generalized (osteo)arthritis: Secondary | ICD-10-CM | POA: Diagnosis not present

## 2024-09-24 DIAGNOSIS — K5790 Diverticulosis of intestine, part unspecified, without perforation or abscess without bleeding: Secondary | ICD-10-CM | POA: Diagnosis not present

## 2024-09-24 DIAGNOSIS — I10 Essential (primary) hypertension: Secondary | ICD-10-CM | POA: Diagnosis not present

## 2024-09-24 DIAGNOSIS — R7309 Other abnormal glucose: Secondary | ICD-10-CM | POA: Diagnosis not present

## 2024-09-25 ENCOUNTER — Ambulatory Visit (INDEPENDENT_AMBULATORY_CARE_PROVIDER_SITE_OTHER)

## 2024-09-25 ENCOUNTER — Encounter: Payer: Self-pay | Admitting: Podiatry

## 2024-09-25 ENCOUNTER — Ambulatory Visit: Admitting: Podiatry

## 2024-09-25 VITALS — Ht 66.0 in | Wt 151.0 lb

## 2024-09-25 DIAGNOSIS — I10 Essential (primary) hypertension: Secondary | ICD-10-CM | POA: Insufficient documentation

## 2024-09-25 DIAGNOSIS — R3129 Other microscopic hematuria: Secondary | ICD-10-CM | POA: Insufficient documentation

## 2024-09-25 DIAGNOSIS — L84 Corns and callosities: Secondary | ICD-10-CM | POA: Diagnosis not present

## 2024-09-25 DIAGNOSIS — G608 Other hereditary and idiopathic neuropathies: Secondary | ICD-10-CM | POA: Diagnosis not present

## 2024-09-25 NOTE — Progress Notes (Signed)
 Patient presents today to pick up custom molded foot orthotics, diagnosed with PF by Dr. Lydia Sams.   Orthotics were dispensed and fit was satisfactory. Reviewed instructions for break-in and wear. Written instructions given to patient.  Patient will follow up as needed.

## 2024-10-04 NOTE — Progress Notes (Signed)
  Subjective:  Patient ID: Alexis Neal, female    DOB: Jan 22, 1936,  MRN: [id number]  Alexis Neal presents to clinic today for at risk foot care with history of peripheral neuropathy  Chief Complaint  Patient presents with   Nail Problem    Pt is here for [facility], PCP is Dr Sadie and LOV was yesterday.   New problem(s): None.   PCP is Hande, Tamra, MD.  No Known Allergies  Review of Systems: Negative except as noted in the HPI.  Objective: No changes noted in today's physical examination. There were no vitals filed for this visit. Alexis Neal is a pleasant 88 y.o. female WD, WN in NAD. AAO x 3.  Vascular Examination: Capillary refill time immediate b/l. Palpable pedal pulses. Pedal hair present b/l. Pedal edema absent. No pain with calf compression b/l. Skin temperature gradient WNL b/l. No cyanosis or clubbing b/l. No ischemia or gangrene noted b/l.   Neurological Examination: Sensation grossly intact b/l with 10 gram monofilament. Vibratory sensation intact b/l.   Dermatological Examination: Pedal skin with normal turgor, texture and tone b/l.  No open wounds. No interdigital macerations.   Toenails 1-5 b/l thick, discolored, elongated with subungual debris and pain on dorsal palpation.   Hyperkeratotic lesion(s) plantar IPJ of right great toe.  No erythema, no edema, no drainage, no fluctuance.  Musculoskeletal Examination: Muscle strength 5/5 to all lower extremity muscle groups bilaterally. Hallux valgus with bunion deformity noted right lower extremity.  Radiographs: None  Assessment/Plan: 1. Callus   2. Sensory peripheral neuropathy   -Patient was evaluated today. All questions/concerns addressed on today's visit. -Patient to continue soft, supportive shoe gear daily. -Callus(es) plantar IPJ of right great toe pared utilizing sharp debridement with sterile blade without complication or incident. Total number debrided =1. -As a courtesy, toenails 1-5 b/l  were debrided in length and girth with sterile nail nippers and dremel file without incident. -Patient/POA to call should there be question/concern in the interim.   Return in about 3 months (around 12/26/2024).  Delon LITTIE Merlin, DPM      [place] LOCATION: [address] [place] [address]                   Office [phone]   [facility] LOCATION: [address] [place], [place] [address] Office [phone]

## 2025-01-04 ENCOUNTER — Ambulatory Visit: Admitting: Podiatry
